# Patient Record
Sex: Female | Born: 1955 | Race: White | Hispanic: No | Marital: Married | Smoking: Former smoker
Health system: Southern US, Community
[De-identification: ages and names within clinical notes are randomized; demographics above are authoritative.]

## PROBLEM LIST (undated history)

## (undated) DIAGNOSIS — D126 Benign neoplasm of colon, unspecified: Secondary | ICD-10-CM

## (undated) DIAGNOSIS — C50919 Malignant neoplasm of unspecified site of unspecified female breast: Secondary | ICD-10-CM

## (undated) DIAGNOSIS — M1990 Unspecified osteoarthritis, unspecified site: Secondary | ICD-10-CM

## (undated) DIAGNOSIS — C50912 Malignant neoplasm of unspecified site of left female breast: Principal | ICD-10-CM

## (undated) DIAGNOSIS — K5790 Diverticulosis of intestine, part unspecified, without perforation or abscess without bleeding: Secondary | ICD-10-CM

## (undated) HISTORY — DX: Malignant neoplasm of unspecified site of unspecified female breast: C50.919

## (undated) HISTORY — DX: Diverticulosis of intestine, part unspecified, without perforation or abscess without bleeding: K57.90

## (undated) HISTORY — DX: Benign neoplasm of colon, unspecified: D12.6

## (undated) HISTORY — DX: Unspecified osteoarthritis, unspecified site: M19.90

## (undated) HISTORY — DX: Malignant neoplasm of unspecified site of left female breast: C50.912

---

## 1999-05-26 ENCOUNTER — Encounter: Admission: RE | Admit: 1999-05-26 | Discharge: 1999-05-26 | Payer: Self-pay | Admitting: Internal Medicine

## 1999-05-26 ENCOUNTER — Encounter: Payer: Self-pay | Admitting: Internal Medicine

## 2000-06-03 ENCOUNTER — Ambulatory Visit (HOSPITAL_COMMUNITY): Admission: RE | Admit: 2000-06-03 | Discharge: 2000-06-03 | Payer: Self-pay | Admitting: Obstetrics & Gynecology

## 2002-03-05 HISTORY — PX: MASTECTOMY, RADICAL: SHX710

## 2002-03-05 HISTORY — PX: TRAM: SHX5363

## 2002-06-04 DIAGNOSIS — C50912 Malignant neoplasm of unspecified site of left female breast: Secondary | ICD-10-CM | POA: Insufficient documentation

## 2002-06-04 HISTORY — DX: Malignant neoplasm of unspecified site of left female breast: C50.912

## 2002-06-08 ENCOUNTER — Other Ambulatory Visit: Admission: RE | Admit: 2002-06-08 | Discharge: 2002-06-08 | Payer: Self-pay | Admitting: Obstetrics & Gynecology

## 2002-06-11 ENCOUNTER — Encounter: Payer: Self-pay | Admitting: Obstetrics & Gynecology

## 2002-06-11 ENCOUNTER — Encounter: Admission: RE | Admit: 2002-06-11 | Discharge: 2002-06-11 | Payer: Self-pay | Admitting: Obstetrics & Gynecology

## 2002-06-11 ENCOUNTER — Other Ambulatory Visit: Admission: RE | Admit: 2002-06-11 | Discharge: 2002-06-11 | Payer: Self-pay | Admitting: Radiology

## 2002-06-18 ENCOUNTER — Encounter: Payer: Self-pay | Admitting: General Surgery

## 2002-06-18 ENCOUNTER — Encounter (HOSPITAL_COMMUNITY): Admission: RE | Admit: 2002-06-18 | Discharge: 2002-09-16 | Payer: Self-pay | Admitting: General Surgery

## 2002-06-19 ENCOUNTER — Encounter: Payer: Self-pay | Admitting: General Surgery

## 2002-06-22 ENCOUNTER — Encounter: Admission: RE | Admit: 2002-06-22 | Discharge: 2002-06-22 | Payer: Self-pay | Admitting: General Surgery

## 2002-06-22 ENCOUNTER — Encounter: Payer: Self-pay | Admitting: General Surgery

## 2002-06-23 ENCOUNTER — Encounter: Payer: Self-pay | Admitting: General Surgery

## 2002-06-23 ENCOUNTER — Ambulatory Visit (HOSPITAL_BASED_OUTPATIENT_CLINIC_OR_DEPARTMENT_OTHER): Admission: RE | Admit: 2002-06-23 | Discharge: 2002-06-23 | Payer: Self-pay | Admitting: General Surgery

## 2002-06-23 ENCOUNTER — Encounter (INDEPENDENT_AMBULATORY_CARE_PROVIDER_SITE_OTHER): Payer: Self-pay | Admitting: Specialist

## 2002-07-21 ENCOUNTER — Ambulatory Visit: Admission: RE | Admit: 2002-07-21 | Discharge: 2002-08-26 | Payer: Self-pay | Admitting: Radiation Oncology

## 2002-08-25 ENCOUNTER — Inpatient Hospital Stay (HOSPITAL_COMMUNITY): Admission: RE | Admit: 2002-08-25 | Discharge: 2002-08-27 | Payer: Self-pay | Admitting: General Surgery

## 2002-08-25 ENCOUNTER — Encounter (INDEPENDENT_AMBULATORY_CARE_PROVIDER_SITE_OTHER): Payer: Self-pay | Admitting: Specialist

## 2002-09-09 ENCOUNTER — Encounter: Payer: Self-pay | Admitting: Oncology

## 2002-09-09 ENCOUNTER — Ambulatory Visit (HOSPITAL_COMMUNITY): Admission: RE | Admit: 2002-09-09 | Discharge: 2002-09-09 | Payer: Self-pay | Admitting: Oncology

## 2002-09-14 ENCOUNTER — Ambulatory Visit (HOSPITAL_COMMUNITY): Admission: RE | Admit: 2002-09-14 | Discharge: 2002-09-14 | Payer: Self-pay | Admitting: Oncology

## 2002-09-14 ENCOUNTER — Encounter: Payer: Self-pay | Admitting: Oncology

## 2002-10-08 ENCOUNTER — Ambulatory Visit (HOSPITAL_COMMUNITY): Admission: RE | Admit: 2002-10-08 | Discharge: 2002-10-08 | Payer: Self-pay | Admitting: General Surgery

## 2002-10-08 ENCOUNTER — Encounter: Payer: Self-pay | Admitting: General Surgery

## 2003-02-10 ENCOUNTER — Ambulatory Visit (HOSPITAL_COMMUNITY): Admission: RE | Admit: 2003-02-10 | Discharge: 2003-02-10 | Payer: Self-pay | Admitting: Oncology

## 2003-03-03 ENCOUNTER — Ambulatory Visit (HOSPITAL_BASED_OUTPATIENT_CLINIC_OR_DEPARTMENT_OTHER): Admission: RE | Admit: 2003-03-03 | Discharge: 2003-03-03 | Payer: Self-pay | Admitting: Plastic Surgery

## 2003-03-04 ENCOUNTER — Ambulatory Visit (HOSPITAL_COMMUNITY): Admission: RE | Admit: 2003-03-04 | Discharge: 2003-03-04 | Payer: Self-pay | Admitting: Oncology

## 2003-06-15 ENCOUNTER — Other Ambulatory Visit: Admission: RE | Admit: 2003-06-15 | Discharge: 2003-06-15 | Payer: Self-pay | Admitting: Obstetrics & Gynecology

## 2003-06-15 ENCOUNTER — Encounter: Admission: RE | Admit: 2003-06-15 | Discharge: 2003-06-15 | Payer: Self-pay | Admitting: Oncology

## 2004-01-26 ENCOUNTER — Ambulatory Visit: Payer: Self-pay | Admitting: Oncology

## 2004-06-14 ENCOUNTER — Encounter: Admission: RE | Admit: 2004-06-14 | Discharge: 2004-06-14 | Payer: Self-pay | Admitting: Obstetrics & Gynecology

## 2004-07-24 ENCOUNTER — Other Ambulatory Visit: Admission: RE | Admit: 2004-07-24 | Discharge: 2004-07-24 | Payer: Self-pay | Admitting: Obstetrics & Gynecology

## 2004-08-01 ENCOUNTER — Ambulatory Visit: Payer: Self-pay | Admitting: Oncology

## 2004-10-27 ENCOUNTER — Ambulatory Visit: Payer: Self-pay | Admitting: Internal Medicine

## 2004-11-14 ENCOUNTER — Encounter: Admission: RE | Admit: 2004-11-14 | Discharge: 2004-11-14 | Payer: Self-pay | Admitting: Oncology

## 2005-02-01 ENCOUNTER — Ambulatory Visit: Payer: Self-pay | Admitting: Oncology

## 2005-06-19 ENCOUNTER — Ambulatory Visit: Payer: Self-pay | Admitting: Internal Medicine

## 2005-07-02 ENCOUNTER — Encounter: Admission: RE | Admit: 2005-07-02 | Discharge: 2005-07-02 | Payer: Self-pay | Admitting: Oncology

## 2005-07-29 ENCOUNTER — Ambulatory Visit: Payer: Self-pay | Admitting: Oncology

## 2005-08-03 LAB — FSH/LH: FSH: 41.7 m[IU]/mL

## 2005-08-03 LAB — CBC WITH DIFFERENTIAL/PLATELET
Basophils Absolute: 0 10*3/uL (ref 0.0–0.1)
EOS%: 0.9 % (ref 0.0–7.0)
HCT: 44.4 % (ref 34.8–46.6)
HGB: 15.4 g/dL (ref 11.6–15.9)
MCH: 31.3 pg (ref 26.0–34.0)
MCV: 90.1 fL (ref 81.0–101.0)
MONO%: 7.6 % (ref 0.0–13.0)
NEUT%: 67.6 % (ref 39.6–76.8)
RDW: 13 % (ref 11.3–14.5)

## 2005-08-03 LAB — COMPREHENSIVE METABOLIC PANEL
AST: 20 U/L (ref 0–37)
Albumin: 4.5 g/dL (ref 3.5–5.2)
Alkaline Phosphatase: 80 U/L (ref 39–117)
BUN: 14 mg/dL (ref 6–23)
Potassium: 4.5 mEq/L (ref 3.5–5.3)
Sodium: 142 mEq/L (ref 135–145)
Total Protein: 7.1 g/dL (ref 6.0–8.3)

## 2005-09-13 ENCOUNTER — Ambulatory Visit: Payer: Self-pay | Admitting: Oncology

## 2005-11-30 ENCOUNTER — Ambulatory Visit: Payer: Self-pay | Admitting: Oncology

## 2005-12-12 LAB — COMPREHENSIVE METABOLIC PANEL
ALT: 17 U/L (ref 0–40)
AST: 14 U/L (ref 0–37)
Calcium: 9.5 mg/dL (ref 8.4–10.5)
Chloride: 104 mEq/L (ref 96–112)
Creatinine, Ser: 0.75 mg/dL (ref 0.40–1.20)
Potassium: 3.7 mEq/L (ref 3.5–5.3)
Sodium: 140 mEq/L (ref 135–145)

## 2005-12-12 LAB — CBC WITH DIFFERENTIAL/PLATELET
BASO%: 0.4 % (ref 0.0–2.0)
EOS%: 1 % (ref 0.0–7.0)
MCH: 31.4 pg (ref 26.0–34.0)
MCHC: 34.9 g/dL (ref 32.0–36.0)
NEUT%: 68.1 % (ref 39.6–76.8)
RBC: 4.88 10*6/uL (ref 3.70–5.32)
RDW: 12.2 % (ref 11.3–14.5)
lymph#: 1.3 10*3/uL (ref 0.9–3.3)

## 2006-04-05 ENCOUNTER — Ambulatory Visit: Payer: Self-pay | Admitting: Oncology

## 2006-04-10 LAB — COMPREHENSIVE METABOLIC PANEL
AST: 24 U/L (ref 0–37)
Alkaline Phosphatase: 110 U/L (ref 39–117)
BUN: 13 mg/dL (ref 6–23)
Creatinine, Ser: 0.77 mg/dL (ref 0.40–1.20)
Glucose, Bld: 81 mg/dL (ref 70–99)
Potassium: 4.4 mEq/L (ref 3.5–5.3)
Total Bilirubin: 0.4 mg/dL (ref 0.3–1.2)

## 2006-04-10 LAB — CBC WITH DIFFERENTIAL/PLATELET
Basophils Absolute: 0 10*3/uL (ref 0.0–0.1)
Eosinophils Absolute: 0.1 10*3/uL (ref 0.0–0.5)
HGB: 16.1 g/dL — ABNORMAL HIGH (ref 11.6–15.9)
MCV: 87.8 fL (ref 81.0–101.0)
MONO#: 0.5 10*3/uL (ref 0.1–0.9)
MONO%: 9.1 % (ref 0.0–13.0)
NEUT#: 3.4 10*3/uL (ref 1.5–6.5)
RDW: 12.5 % (ref 11.3–14.5)
WBC: 5.1 10*3/uL (ref 3.9–10.0)
lymph#: 1 10*3/uL (ref 0.9–3.3)

## 2006-07-30 ENCOUNTER — Encounter: Admission: RE | Admit: 2006-07-30 | Discharge: 2006-07-30 | Payer: Self-pay | Admitting: Obstetrics & Gynecology

## 2006-08-05 ENCOUNTER — Ambulatory Visit: Payer: Self-pay | Admitting: Oncology

## 2006-08-14 LAB — CBC WITH DIFFERENTIAL/PLATELET
Basophils Absolute: 0 10*3/uL (ref 0.0–0.1)
EOS%: 2.6 % (ref 0.0–7.0)
Eosinophils Absolute: 0.1 10*3/uL (ref 0.0–0.5)
HCT: 44.1 % (ref 34.8–46.6)
HGB: 15.8 g/dL (ref 11.6–15.9)
MCH: 31.5 pg (ref 26.0–34.0)
MCV: 87.8 fL (ref 81.0–101.0)
MONO%: 10 % (ref 0.0–13.0)
NEUT#: 2.7 10*3/uL (ref 1.5–6.5)
NEUT%: 57.9 % (ref 39.6–76.8)
Platelets: 254 10*3/uL (ref 145–400)
RDW: 12.1 % (ref 11.3–14.5)

## 2006-08-14 LAB — CANCER ANTIGEN 27.29: CA 27.29: 32 U/mL (ref 0–39)

## 2006-08-14 LAB — COMPREHENSIVE METABOLIC PANEL
AST: 23 U/L (ref 0–37)
Albumin: 4.3 g/dL (ref 3.5–5.2)
Alkaline Phosphatase: 89 U/L (ref 39–117)
BUN: 15 mg/dL (ref 6–23)
Calcium: 9.2 mg/dL (ref 8.4–10.5)
Creatinine, Ser: 0.79 mg/dL (ref 0.40–1.20)
Glucose, Bld: 82 mg/dL (ref 70–99)
Potassium: 3.9 mEq/L (ref 3.5–5.3)

## 2007-02-24 ENCOUNTER — Ambulatory Visit: Payer: Self-pay | Admitting: Oncology

## 2007-04-21 ENCOUNTER — Ambulatory Visit: Payer: Self-pay | Admitting: Oncology

## 2007-04-23 LAB — CBC WITH DIFFERENTIAL/PLATELET
Basophils Absolute: 0 10*3/uL (ref 0.0–0.1)
EOS%: 1.2 % (ref 0.0–7.0)
HGB: 15.5 g/dL (ref 11.6–15.9)
MCH: 32.3 pg (ref 26.0–34.0)
MONO#: 0.3 10*3/uL (ref 0.1–0.9)
NEUT#: 4.1 10*3/uL (ref 1.5–6.5)
RDW: 12.2 % (ref 11.3–14.5)
WBC: 5.8 10*3/uL (ref 3.9–10.0)
lymph#: 1.2 10*3/uL (ref 0.9–3.3)

## 2007-04-24 LAB — COMPREHENSIVE METABOLIC PANEL
AST: 15 U/L (ref 0–37)
Alkaline Phosphatase: 92 U/L (ref 39–117)
BUN: 18 mg/dL (ref 6–23)
Creatinine, Ser: 0.76 mg/dL (ref 0.40–1.20)

## 2007-04-24 LAB — FSH/LH
FSH: 33.6 m[IU]/mL
LH: 25.8 m[IU]/mL

## 2007-04-24 LAB — VITAMIN D 25 HYDROXY (VIT D DEFICIENCY, FRACTURES): Vit D, 25-Hydroxy: 15 ng/mL — ABNORMAL LOW (ref 30–89)

## 2007-04-28 LAB — VITAMIN D 1,25 DIHYDROXY: Vit D, 1,25-Dihydroxy: 24 pg/mL (ref 6–62)

## 2007-05-07 LAB — ESTRADIOL, ULTRA SENS: Estradiol, Ultra Sensitive: 7 pg/mL

## 2007-05-13 ENCOUNTER — Encounter: Admission: RE | Admit: 2007-05-13 | Discharge: 2007-05-13 | Payer: Self-pay | Admitting: Oncology

## 2007-06-07 ENCOUNTER — Encounter: Payer: Self-pay | Admitting: Internal Medicine

## 2007-07-04 ENCOUNTER — Ambulatory Visit: Payer: Self-pay | Admitting: Oncology

## 2007-07-04 LAB — CBC WITH DIFFERENTIAL/PLATELET
EOS%: 2.3 % (ref 0.0–7.0)
Eosinophils Absolute: 0.1 10*3/uL (ref 0.0–0.5)
MCH: 31.9 pg (ref 26.0–34.0)
MCV: 89.7 fL (ref 81.0–101.0)
MONO%: 5.5 % (ref 0.0–13.0)
NEUT#: 3.5 10*3/uL (ref 1.5–6.5)
RBC: 4.57 10*6/uL (ref 3.70–5.32)
RDW: 11.9 % (ref 11.3–14.5)
lymph#: 1.5 10*3/uL (ref 0.9–3.3)

## 2007-07-04 LAB — COMPREHENSIVE METABOLIC PANEL
AST: 24 U/L (ref 0–37)
Albumin: 4.5 g/dL (ref 3.5–5.2)
Alkaline Phosphatase: 88 U/L (ref 39–117)
Chloride: 108 mEq/L (ref 96–112)
Potassium: 3.7 mEq/L (ref 3.5–5.3)
Sodium: 139 mEq/L (ref 135–145)
Total Protein: 7.3 g/dL (ref 6.0–8.3)

## 2007-07-04 LAB — CANCER ANTIGEN 27.29: CA 27.29: 40 U/mL — ABNORMAL HIGH (ref 0–39)

## 2007-07-04 LAB — FOLLICLE STIMULATING HORMONE: FSH: 47.2 m[IU]/mL

## 2007-07-04 LAB — LACTATE DEHYDROGENASE: LDH: 176 U/L (ref 94–250)

## 2007-07-10 LAB — ESTRADIOL, ULTRA SENS: Estradiol, Ultra Sensitive: 4 pg/mL

## 2007-07-31 ENCOUNTER — Encounter: Admission: RE | Admit: 2007-07-31 | Discharge: 2007-07-31 | Payer: Self-pay | Admitting: Oncology

## 2007-08-05 ENCOUNTER — Encounter: Admission: RE | Admit: 2007-08-05 | Discharge: 2007-08-05 | Payer: Self-pay | Admitting: Oncology

## 2007-09-25 ENCOUNTER — Ambulatory Visit: Payer: Self-pay | Admitting: Oncology

## 2007-10-22 LAB — CANCER ANTIGEN 27.29: CA 27.29: 47 U/mL — ABNORMAL HIGH (ref 0–39)

## 2007-10-24 ENCOUNTER — Ambulatory Visit: Payer: Self-pay | Admitting: Internal Medicine

## 2007-10-24 DIAGNOSIS — M79609 Pain in unspecified limb: Secondary | ICD-10-CM

## 2007-10-29 ENCOUNTER — Telehealth: Payer: Self-pay | Admitting: Internal Medicine

## 2007-10-29 ENCOUNTER — Ambulatory Visit: Payer: Self-pay | Admitting: Internal Medicine

## 2007-10-30 ENCOUNTER — Telehealth (INDEPENDENT_AMBULATORY_CARE_PROVIDER_SITE_OTHER): Payer: Self-pay

## 2007-11-03 ENCOUNTER — Telehealth: Payer: Self-pay | Admitting: Internal Medicine

## 2007-12-22 ENCOUNTER — Ambulatory Visit: Payer: Self-pay | Admitting: Oncology

## 2007-12-30 LAB — CANCER ANTIGEN 27.29: CA 27.29: 41 U/mL — ABNORMAL HIGH (ref 0–39)

## 2008-02-06 ENCOUNTER — Ambulatory Visit: Payer: Self-pay | Admitting: Oncology

## 2008-04-08 ENCOUNTER — Ambulatory Visit: Payer: Self-pay | Admitting: Internal Medicine

## 2008-04-08 DIAGNOSIS — K5289 Other specified noninfective gastroenteritis and colitis: Secondary | ICD-10-CM | POA: Insufficient documentation

## 2008-06-03 ENCOUNTER — Ambulatory Visit: Payer: Self-pay | Admitting: Oncology

## 2008-06-08 LAB — CBC WITH DIFFERENTIAL/PLATELET
BASO%: 0.4 % (ref 0.0–2.0)
LYMPH%: 22.7 % (ref 14.0–49.7)
MCHC: 35.1 g/dL (ref 31.5–36.0)
MONO#: 0.3 10*3/uL (ref 0.1–0.9)
NEUT#: 3.8 10*3/uL (ref 1.5–6.5)
Platelets: 266 10*3/uL (ref 145–400)
RBC: 5.01 10*6/uL (ref 3.70–5.45)
RDW: 13.1 % (ref 11.2–14.5)
WBC: 5.5 10*3/uL (ref 3.9–10.3)

## 2008-06-09 LAB — CANCER ANTIGEN 27.29: CA 27.29: 34 U/mL (ref 0–39)

## 2008-06-09 LAB — COMPREHENSIVE METABOLIC PANEL
ALT: 29 U/L (ref 0–35)
Albumin: 4.5 g/dL (ref 3.5–5.2)
CO2: 22 mEq/L (ref 19–32)
Potassium: 4.2 mEq/L (ref 3.5–5.3)
Sodium: 137 mEq/L (ref 135–145)
Total Bilirubin: 0.6 mg/dL (ref 0.3–1.2)
Total Protein: 7.3 g/dL (ref 6.0–8.3)

## 2008-06-18 LAB — ESTRADIOL, ULTRA SENS

## 2008-08-10 ENCOUNTER — Encounter: Admission: RE | Admit: 2008-08-10 | Discharge: 2008-08-10 | Payer: Self-pay | Admitting: Oncology

## 2009-02-02 ENCOUNTER — Ambulatory Visit: Payer: Self-pay | Admitting: Oncology

## 2009-03-14 ENCOUNTER — Ambulatory Visit: Payer: Self-pay | Admitting: Oncology

## 2009-03-16 LAB — CBC WITH DIFFERENTIAL/PLATELET
Basophils Absolute: 0 10*3/uL (ref 0.0–0.1)
EOS%: 1.6 % (ref 0.0–7.0)
Eosinophils Absolute: 0.1 10*3/uL (ref 0.0–0.5)
HGB: 16.5 g/dL — ABNORMAL HIGH (ref 11.6–15.9)
MCV: 90.6 fL (ref 79.5–101.0)
MONO%: 6.2 % (ref 0.0–14.0)
NEUT#: 4.1 10*3/uL (ref 1.5–6.5)
RBC: 5.26 10*6/uL (ref 3.70–5.45)
RDW: 12.4 % (ref 11.2–14.5)
lymph#: 1.4 10*3/uL (ref 0.9–3.3)

## 2009-03-17 LAB — COMPREHENSIVE METABOLIC PANEL
AST: 30 U/L (ref 0–37)
Albumin: 4.4 g/dL (ref 3.5–5.2)
Alkaline Phosphatase: 100 U/L (ref 39–117)
BUN: 12 mg/dL (ref 6–23)
Calcium: 10.2 mg/dL (ref 8.4–10.5)
Chloride: 105 mEq/L (ref 96–112)
Glucose, Bld: 67 mg/dL — ABNORMAL LOW (ref 70–99)
Potassium: 3.7 mEq/L (ref 3.5–5.3)
Sodium: 140 mEq/L (ref 135–145)
Total Protein: 7.6 g/dL (ref 6.0–8.3)

## 2009-04-07 ENCOUNTER — Ambulatory Visit: Payer: Self-pay | Admitting: Internal Medicine

## 2009-04-07 DIAGNOSIS — J069 Acute upper respiratory infection, unspecified: Secondary | ICD-10-CM | POA: Insufficient documentation

## 2009-06-21 ENCOUNTER — Other Ambulatory Visit: Admission: RE | Admit: 2009-06-21 | Discharge: 2009-06-21 | Payer: Self-pay | Admitting: Obstetrics and Gynecology

## 2009-08-16 ENCOUNTER — Encounter: Admission: RE | Admit: 2009-08-16 | Discharge: 2009-08-16 | Payer: Self-pay | Admitting: Oncology

## 2009-09-02 DIAGNOSIS — D126 Benign neoplasm of colon, unspecified: Secondary | ICD-10-CM

## 2009-09-02 HISTORY — DX: Benign neoplasm of colon, unspecified: D12.6

## 2009-11-17 ENCOUNTER — Ambulatory Visit: Payer: Self-pay | Admitting: Internal Medicine

## 2009-11-17 DIAGNOSIS — M549 Dorsalgia, unspecified: Secondary | ICD-10-CM | POA: Insufficient documentation

## 2010-03-02 ENCOUNTER — Ambulatory Visit: Payer: Self-pay | Admitting: Oncology

## 2010-03-26 ENCOUNTER — Encounter: Payer: Self-pay | Admitting: Oncology

## 2010-04-06 NOTE — Letter (Signed)
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   [place], [place] [address]   Phone: [phone]  Fax: [phone]    November 17, 2009   Employee:  BEVA REMUND    To Whom It May Concern:   For Medical reasons, please excuse the above named employee from work for the following dates:  Start:   11-18-09  End:   11-21-09  If you need additional information, please feel free to contact our office.         Sincerely,    Gordy Savers  MD

## 2010-04-06 NOTE — Letter (Signed)
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   [place], [place] [address]   Phone: [phone]  Fax: [phone]    April 07, 2009   Employee:  LANEKA MCGRORY    To Whom It May Concern:   For Medical reasons, please excuse the above named employee from work for the following dates:  Start:   04-07-2009  End:   04-09-2009  If you need additional information, please feel free to contact our office.         Sincerely,    Gordy Savers  MD

## 2010-04-06 NOTE — Assessment & Plan Note (Signed)
Summary: ?pulled muscle in lower back and hip area/cjr   Vital Signs:  Patient profile:   55 year old female Weight:      161 pounds Temp:     98.6 degrees F oral BP sitting:   114 / 70  (right arm) Cuff size:   regular  Vitals Entered By: Duard Brady LPN (November 17, 2009 3:30 PM) CC: c/o pulled muscle, (L) hip , unloading lawnmower          **declines flu vaccine Is Patient Diabetic? No   CC:  c/o pulled muscle, (L) hip , and unloading lawnmower          **declines flu vaccine.  History of Present Illness: 55 year old patient, who presents with a chief complaint of left lumbar back pain after unloading a lawnmower at home.  Last night.  Pain is moderate and is aggravated by twisting, bending, and movement.  No radicular pain down the left leg.  She states she has had some minor low back issues over the years.  Allergies: 1)  ! Sulfamethoxazole (Sulfamethoxazole)  Physical Exam  General:  Well-developed,well-nourished,in no acute distress; alert,appropriate and cooperative throughout examination Msk:  negative straight leg testing;  range of motion left hip intact;   she had considerable tight tense lumbar musculature on the left, which was slightly tender to palpation   Impression & Recommendations:  Problem # 1:  BACK PAIN (ICD-724.5)  Her updated medication list for this problem includes:    Cyclobenzaprine Hcl 10 Mg Tabs (Cyclobenzaprine hcl) ..... One every 8 hours as needed for muscle spasm  Her updated medication list for this problem includes:    Cyclobenzaprine Hcl 10 Mg Tabs (Cyclobenzaprine hcl) ..... One every 8 hours as needed for muscle spasm  Complete Medication List: 1)  Cyclobenzaprine Hcl 10 Mg Tabs (Cyclobenzaprine hcl) .... One every 8 hours as needed for muscle spasm  Patient Instructions: 1)  Most patients (90%) with low back pain will improve with time (2-6 weeks). Keep active but avoid activities that are painful. Apply moist heat and/or  ice to lower back several times a day. Prescriptions: CYCLOBENZAPRINE HCL 10 MG TABS (CYCLOBENZAPRINE HCL) one every 8 hours as needed for muscle spasm  #30 x 0   Entered and Authorized by:   Gordy Savers  MD   Signed by:   Gordy Savers  MD on 11/17/2009   Method used:   Electronically to        [facility]  [place] [address] (retail)       [address]       [place], [place]  [address]       Ph: [phone] or [phone]       Fax: [phone]   RxID:   [id number]

## 2010-04-06 NOTE — Assessment & Plan Note (Signed)
Summary: SORE THROAT, CONGESTION // RS   Vital Signs:  Patient profile:   55 year old female Weight:      164 pounds Temp:     99.4 degrees F oral BP sitting:   114 / 76  (right arm) Cuff size:   regular  Vitals Entered By: Raechel Ache, RN (April 07, 2009 1:51 PM) CC: C/o sore throat, fever, chills, aches and dry cough   CC:  C/o sore throat, fever, chills, and aches and dry cough.  History of Present Illness: 55 year old patient, who presents with a two-day history of sore throat, fever, chilliness, rhinorrhea, dry, nonproductive cough.  She describes achiness.  She missed work today and needs a note.  No abdominal pain, chest pain or purulent sputum production.  Denies any shortness of breath.  Allergies: 1)  ! Sulfamethoxazole (Sulfamethoxazole)  Past History:  Past Medical History: unremarkable  Review of Systems       The patient complains of anorexia, fever, hoarseness, and prolonged cough.    Physical Exam  General:  Well-developed,well-nourished,in no acute distress; alert,appropriate and cooperative throughout examination Head:  Normocephalic and atraumatic without obvious abnormalities. No apparent alopecia or balding. Eyes:  No corneal or conjunctival inflammation noted. EOMI. Perrla. Funduscopic exam benign, without hemorrhages, exudates or papilledema. Vision grossly normal. Ears:  External ear exam shows no significant lesions or deformities.  Otoscopic examination reveals clear canals, tympanic membranes are intact bilaterally without bulging, retraction, inflammation or discharge. Hearing is grossly normal bilaterally. Nose:  External nasal examination shows no deformity or inflammation. Nasal mucosa are pink and moist without lesions or exudates. Mouth:  pharyngeal erythema.  pharyngeal erythema.   Neck:  No deformities, masses, or tenderness noted. Lungs:  Normal respiratory effort, chest expands symmetrically. Lungs are clear to auscultation, no  crackles or wheezes. Heart:  Normal rate and regular rhythm. S1 and S2 normal without gallop, murmur, click, rub or other extra sounds. Abdomen:  Bowel sounds positive,abdomen soft and non-tender without masses, organomegaly or hernias noted.   Impression & Recommendations:  Problem # 1:  URI (ICD-465.9)  The following medications were removed from the medication list:    Promethazine Hcl 25 Mg Tabs (Promethazine hcl) ..... One every 6 hours as needed for nausea Her updated medication list for this problem includes:    Hydrocodone-homatropine 5-1.5 Mg/70ml Syrp (Hydrocodone-homatropine) .Marland Kitchen... 1 teaspoon every 6 hours as needed for cough  The following medications were removed from the medication list:    Promethazine Hcl 25 Mg Tabs (Promethazine hcl) ..... One every 6 hours as needed for nausea Her updated medication list for this problem includes:    Hydrocodone-homatropine 5-1.5 Mg/27ml Syrp (Hydrocodone-homatropine) .Marland Kitchen... 1 teaspoon every 6 hours as needed for cough  Complete Medication List: 1)  Hydrocodone-homatropine 5-1.5 Mg/35ml Syrp (Hydrocodone-homatropine) .Marland Kitchen.. 1 teaspoon every 6 hours as needed for cough  Patient Instructions: 1)  Get plenty of rest, drink lots of clear liquids, and use Tylenol or Ibuprofen for fever and comfort. Return in 7-10 days if you're not better:sooner if you're feeling worse. Prescriptions: HYDROCODONE-HOMATROPINE 5-1.5 MG/5ML SYRP (HYDROCODONE-HOMATROPINE) 1 teaspoon every 6 hours as needed for cough  #6 oz x 0   Entered and Authorized by:   Gordy Savers  MD   Signed by:   Gordy Savers  MD on 04/07/2009   Method used:   Print then Give to Patient   RxID:   [id number]

## 2010-04-11 ENCOUNTER — Other Ambulatory Visit: Payer: Self-pay | Admitting: Oncology

## 2010-04-11 ENCOUNTER — Encounter (HOSPITAL_BASED_OUTPATIENT_CLINIC_OR_DEPARTMENT_OTHER): Payer: Federal, State, Local not specified - PPO | Admitting: Oncology

## 2010-04-11 DIAGNOSIS — C50419 Malignant neoplasm of upper-outer quadrant of unspecified female breast: Secondary | ICD-10-CM

## 2010-04-11 LAB — COMPREHENSIVE METABOLIC PANEL
Albumin: 4.3 g/dL (ref 3.5–5.2)
Alkaline Phosphatase: 93 U/L (ref 39–117)
BUN: 17 mg/dL (ref 6–23)
Creatinine, Ser: 0.73 mg/dL (ref 0.40–1.20)
Glucose, Bld: 89 mg/dL (ref 70–99)
Potassium: 3.6 mEq/L (ref 3.5–5.3)
Total Bilirubin: 0.9 mg/dL (ref 0.3–1.2)

## 2010-04-11 LAB — CBC WITH DIFFERENTIAL/PLATELET
BASO%: 0.6 % (ref 0.0–2.0)
Basophils Absolute: 0 10*3/uL (ref 0.0–0.1)
EOS%: 1.1 % (ref 0.0–7.0)
HCT: 44.5 % (ref 34.8–46.6)
HGB: 15.6 g/dL (ref 11.6–15.9)
LYMPH%: 24.6 % (ref 14.0–49.7)
MCH: 31.2 pg (ref 25.1–34.0)
MCHC: 35 g/dL (ref 31.5–36.0)
MCV: 89.2 fL (ref 79.5–101.0)
MONO%: 8.2 % (ref 0.0–14.0)
NEUT%: 65.5 % (ref 38.4–76.8)

## 2010-04-11 LAB — VITAMIN D 25 HYDROXY (VIT D DEFICIENCY, FRACTURES): Vit D, 25-Hydroxy: 37 ng/mL (ref 30–89)

## 2010-04-18 ENCOUNTER — Encounter: Payer: Federal, State, Local not specified - PPO | Admitting: Oncology

## 2010-04-18 ENCOUNTER — Encounter: Payer: Self-pay | Admitting: Internal Medicine

## 2010-04-18 ENCOUNTER — Other Ambulatory Visit: Payer: Self-pay | Admitting: Oncology

## 2010-04-18 DIAGNOSIS — Z780 Asymptomatic menopausal state: Secondary | ICD-10-CM

## 2010-04-18 DIAGNOSIS — Z1231 Encounter for screening mammogram for malignant neoplasm of breast: Secondary | ICD-10-CM

## 2010-04-19 ENCOUNTER — Encounter: Payer: Self-pay | Admitting: Internal Medicine

## 2010-04-19 ENCOUNTER — Ambulatory Visit (INDEPENDENT_AMBULATORY_CARE_PROVIDER_SITE_OTHER): Payer: Federal, State, Local not specified - PPO | Admitting: Internal Medicine

## 2010-04-19 VITALS — BP 100/70 | Temp 98.4°F | Wt 164.0 lb

## 2010-04-19 DIAGNOSIS — R51 Headache: Secondary | ICD-10-CM

## 2010-04-19 NOTE — Progress Notes (Signed)
  Subjective:    Patient ID: Kristen Burke, female    DOB: May 20, 1955, 55 y.o.   MRN: [id number]  HPI  55 year old patient, who presents with the chief complaint of headache.  Superbowl Sunday, she bent down to pick up an object on the floor and when she stood she traumatized the right occipital head region on furniture.  Since this time.  She has had some nausea that has improved and largely resolved.  She still has daily headaches that also are improving and responding well to ibuprofen.  No focal neurological symptoms    Review of Systems  Constitutional: Negative.   HENT: Negative for hearing loss, congestion, sore throat, rhinorrhea, dental problem, sinus pressure and tinnitus.   Eyes: Negative for pain, discharge and visual disturbance.  Respiratory: Negative for cough and shortness of breath.   Cardiovascular: Negative for chest pain, palpitations and leg swelling.  Gastrointestinal: Positive for nausea. Negative for vomiting, abdominal pain, diarrhea, constipation, blood in stool and abdominal distention.  Genitourinary: Negative for dysuria, urgency, frequency, hematuria, flank pain, vaginal bleeding, vaginal discharge, difficulty urinating, vaginal pain and pelvic pain.  Musculoskeletal: Negative for joint swelling, arthralgias and gait problem.  Skin: Negative for rash.  Neurological: Positive for headaches. Negative for dizziness, syncope, speech difficulty, weakness and numbness.  Hematological: Negative for adenopathy.  Psychiatric/Behavioral: Negative for behavioral problems, dysphoric mood and agitation. The patient is not nervous/anxious.        Objective:   Physical Exam  Constitutional: She is oriented to person, place, and time. She appears well-developed and well-nourished.  HENT:  Head: Normocephalic.  Right Ear: External ear normal.  Left Ear: External ear normal.  Mouth/Throat: Oropharynx is clear and moist.       Slight tenderness over the right occipital scalp  area but no hematoma  Eyes: Conjunctivae and EOM are normal. Pupils are equal, round, and reactive to light.  Neck: Normal range of motion. Neck supple. No thyromegaly present.  Cardiovascular: Normal rate, regular rhythm, normal heart sounds and intact distal pulses.   Pulmonary/Chest: Effort normal and breath sounds normal.  Abdominal: She exhibits no mass. There is no tenderness.  Musculoskeletal: Normal range of motion.  Lymphadenopathy:    She has no cervical adenopathy.  Neurological: She is alert and oriented to person, place, and time. She displays normal reflexes. No cranial nerve deficit. She exhibits normal muscle tone. Coordination normal.  Skin: Skin is warm and dry. No rash noted.  Psychiatric: She has a normal mood and affect. Her behavior is normal.          Assessment & Plan:  Headache syndrome-probable postconcussive syndrome.  Situation was discussed at length her symptoms are improving and her clinical exam is unremarkable.  Imaging studies not felt to be indicated at this time.  If she develops persistent or worsening symptoms.  Will reassess

## 2010-04-19 NOTE — Patient Instructions (Signed)
Call or return to clinic prn if these symptoms worsen or fail to improve as anticipated.

## 2010-07-19 ENCOUNTER — Ambulatory Visit (INDEPENDENT_AMBULATORY_CARE_PROVIDER_SITE_OTHER): Payer: Federal, State, Local not specified - PPO | Admitting: Internal Medicine

## 2010-07-19 ENCOUNTER — Encounter: Payer: Self-pay | Admitting: Internal Medicine

## 2010-07-19 VITALS — BP 120/80 | Temp 98.3°F | Wt 169.0 lb

## 2010-07-19 DIAGNOSIS — R51 Headache: Secondary | ICD-10-CM

## 2010-07-19 DIAGNOSIS — M542 Cervicalgia: Secondary | ICD-10-CM

## 2010-07-19 MED ORDER — METHYLPREDNISOLONE ACETATE 80 MG/ML IJ SUSP
80.0000 mg | Freq: Once | INTRAMUSCULAR | Status: AC
  Administered 2010-07-19: 80 mg via INTRAMUSCULAR

## 2010-07-19 MED ORDER — CYCLOBENZAPRINE HCL 10 MG PO TABS: 10.0000 mg | ORAL_TABLET | Freq: Three times a day (TID) | ORAL | Status: DC | PRN

## 2010-07-19 NOTE — Patient Instructions (Signed)
Aleve 2 tablets twice daily  Apply heat to the affected area 3 or 4 times daily as needed Gentle massage and stretching as discussed  Call or return to clinic prn if these symptoms worsen or fail to improve as anticipated.

## 2010-07-19 NOTE — Progress Notes (Signed)
  Subjective:    Patient ID: Kristen Burke, female    DOB: [birth year], 55 y.o.   MRN: [id number]  HPI  55 year old patient who has a one-week history of left posterior neck upper back and shoulder discomfort. She does considerable manual activities at work. Sunday she had significant headaches and worsening neck and upper shoulder discomfort she has had some occasional nausea due to persistent headaches and left neck and shoulder discomfort she presents to the office today for evaluation. She has been taking Aleve without benefit she has used Flexeril in the past due to low back pain with benefit    Review of Systems  Constitutional: Negative.  Negative for fever.  HENT: Positive for neck pain and neck stiffness. Negative for hearing loss, congestion, sore throat, rhinorrhea, dental problem, sinus pressure and tinnitus.   Eyes: Negative for pain, discharge and visual disturbance.  Respiratory: Negative for cough and shortness of breath.   Cardiovascular: Negative for chest pain, palpitations and leg swelling.  Gastrointestinal: Negative for nausea, vomiting, abdominal pain, diarrhea, constipation, blood in stool and abdominal distention.  Genitourinary: Negative for dysuria, urgency, frequency, hematuria, flank pain, vaginal bleeding, vaginal discharge, difficulty urinating, vaginal pain and pelvic pain.  Musculoskeletal: Negative for joint swelling, arthralgias and gait problem.  Skin: Negative for rash.  Neurological: Positive for headaches. Negative for dizziness, syncope, speech difficulty, weakness and numbness.  Hematological: Negative for adenopathy.  Psychiatric/Behavioral: Negative for behavioral problems, dysphoric mood and agitation. The patient is not nervous/anxious.        Objective:   Physical Exam  Constitutional: She is oriented to person, place, and time. She appears well-developed and well-nourished. No distress.  Musculoskeletal: Normal range of motion. She exhibits no  edema and no tenderness.       Range of motion of the head and neck normal although uncomfortable especially with head turning to the left. The left posterior neck musculature and left trapezius muscle tender to touch  Neurological: She is alert and oriented to person, place, and time.          Assessment & Plan:  Headache neck pain. We'll treat with Depo-Medrol sense believe has not been effective. We'll refill her Flexeril. Warm compresses gentle massage and stretching all encouraged

## 2010-07-21 NOTE — Discharge Summary (Signed)
Kristen Burke, Kristen Burke                           ACCOUNT NO.:  [id number]   MEDICAL RECORD NO.:  [id number]                   PATIENT TYPE:  INP   LOCATION:  5714                                 FACILITY:  [facility]   PHYSICIAN:  Kristen Burke, M.D.               DATE OF BIRTH:  22-Sep-1955   DATE OF ADMISSION:  08/25/2002  DATE OF DISCHARGE:  08/27/2002                                 DISCHARGE SUMMARY   ADMISSION DIAGNOSES:  1. Left breast cancer.  2. Acquired abscess left breast.   DISCHARGE DIAGNOSES:  1. Left breast cancer.  2. Acquired abscess left breast.   OPERATION PERFORMED:  Left total mastectomy with immediate reconstruction  utilizing left ipsilateral TRAM flap reconstruction.   CHIEF COMPLAINT:  I have breast cancer.   HISTORY OF PRESENT ILLNESS:  [name] who palpated a new  mass in her left breast and was seen by her gynecologist back in April of  this year.  Ultrasound suggested two small masses in the upper outer  quadrant of the left breast.  Core needle biopsies revealed both to be  carcinoma.  The patient was informed of this and the patient opted to  undergo lumpectomy and radiation therapy.  Lumpectomy was performed along  with sentinel node biopsy.  Sentinel node was negative.  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  For this reason Dr. Earlene Plater opted to proceed with total mastectomy.  The  patient is admitted to the hospital on June 22 for completion of the  mastectomy on the left side.  She also has opted to undergo immediate  reconstruction with an ipsilateral TRAM flap.   PAST MEDICAL HISTORY:  Negative.   PAST SURGICAL HISTORY:  1. Lumpectomy.  2. Sentinel node biopsy.   FAMILY HISTORY:  Positive for mother having breast cancer.   SOCIAL HISTORY:  The patient is married.  She has two children.  She denies  tobacco or alcohol use.   REVIEW OF SYSTEMS:  Negative for any ear,  nose, throat, pulmonary, chest,  cardiovascular, GI, GU problems.   PHYSICAL EXAMINATION:  Please see admission H&P for complete physical  examination.   ADMISSION LABORATORY VALUES:  Hemoglobin 15.5, hematocrit 44.3.  Electrolytes were normal.  Urinalysis was negative.  Chest x-ray showed no  active lung disease.  There is no cardiogram on the chart for review.   HOSPITAL COURSE:  On the day of admission the patient was taken to the  operating room where she underwent left mastectomy and ipsilateral left TRAM  flap reconstruction.  Postoperative course has been completely uneventful.  She was started on a diet on the night of surgery.  The morning after  surgery the diet was advanced.  The patient was allowed to ambulate the  first postoperative day.  Her left TRAM flap looks fine with excellent  capillary refill, good  color, and appears to be completely viable.  On the  second postoperative day the dressings were all removed.  The abdominal  wound was fine, healing nicely, no evidence of infection.  Of note, this is  viable.  All drains will remain in place and are draining greater than 30 mL  per 24 hours.  The patient has been instructed on drain care.  All  instructions to the patient have been supplied by my office and the patient  was advised to call if there are any further problems.   DISCHARGE MEDICATIONS:  1. Keflex 500 mg q.i.d.  2. Vicodin one to two q.4h. p.r.n. pain.   FOLLOW UP:  Will be provided in my office in four days for removal of  drains.                                               Kristen Burke, M.D.    WBB/MEDQ  D:  08/27/2002  T:  08/27/2002  Job:  [id number]

## 2010-07-21 NOTE — Op Note (Signed)
Kristen Burke, Kristen Burke                           ACCOUNT NO.:  [id number]   MEDICAL RECORD NO.:  [id number]                   PATIENT TYPE:  OUT   LOCATION:  XRAY                                 FACILITY:  [facility]   PHYSICIAN:  Alfredia Ferguson, M.D.               DATE OF BIRTH:  1955-12-29   DATE OF PROCEDURE:  03/03/2003  DATE OF DISCHARGE:  02/10/2003                                 OPERATIVE REPORT   PREOPERATIVE DIAGNOSIS:  1. History of breast cancer.  2. Acquired absence of left breast.   POSTOPERATIVE DIAGNOSIS:  1. History of breast cancer.  2. Acquired absence of left breast.   OPERATION PERFORMED:  Left nipple reconstruction.   SURGEON:  Alfredia Ferguson, M.D.   ANESTHESIA:  None required.   INDICATIONS FOR PROCEDURE:  The patient is a 55 year old woman who is status  post left mastectomy and immediate reconstruction with a TRAM flap.  She is  now ready for nipple reconstruction.  She understands the risks of the  surgery including the possibility of failure of the nipple.  She understands  the risk of asymmetry of the two nipples.  In spite of that she wished to  proceed with the surgery.   DESCRIPTION OF PROCEDURE:  Location of the nipple was chosen with the  patient in the sitting position such that it was in a relatively same  position on her reconstructed breast as it is on her normal breast.  The  patient was then placed in a supine position.  A 45 mm diameter circle was  drawn around the chosen location for the nipple.  Within the confines of  this circle, a tripartite flap was marked with the three  points of the flap  pointing to the 3 o'clock, 9o'clock and 12 o'clock position.  There was an  inferiorly based random skin paddle for vascular integrity.  The left  reconstructed TRAM was prepped with Betadine and draped with sterile drapes.  The three points of the flap were incised and the flap was elevated down to  the 2 cm wide skin pedicle.  The tips on each  of the three flaps were  removed to create blunt ends of the flaps.  The donor site was closed by  using 4-0 PDS to unit the dermis of the donor sites.  The skin was closed in  the donor site using running 4-0 chromic suture.  The 3 o'clock and 9o'clock  flaps were rotated towards one another such that the blunt ends of the two  flaps met and these two blunt ends were fixed to each other using  interrupted 4-0 chromic suture.  The flap which was pointing to the 12  o'clock position was placed down on top of the created cylinder by uniting  the other two flaps which closed the cylinder.  This was fixed using 4-0  chromic suture.  This completed  the creation of the nipple.  It was  completely viable at the conclusion.  A bulky dressing was placed around the  nipple and the patient was discharged to home in satisfactory condition.                                               Alfredia Ferguson, M.D.    WBB/MEDQ  D:  03/03/2003  T:  03/03/2003  Job:  [id number]

## 2010-08-08 ENCOUNTER — Other Ambulatory Visit: Payer: Self-pay | Admitting: Obstetrics and Gynecology

## 2010-08-08 ENCOUNTER — Other Ambulatory Visit (HOSPITAL_COMMUNITY)
Admission: RE | Admit: 2010-08-08 | Discharge: 2010-08-08 | Disposition: A | Discharge status: Home / Self Care | Payer: Federal, State, Local not specified - PPO | Source: Ambulatory Visit | Attending: Obstetrics and Gynecology | Admitting: Obstetrics and Gynecology

## 2010-08-08 DIAGNOSIS — Z01419 Encounter for gynecological examination (general) (routine) without abnormal findings: Secondary | ICD-10-CM | POA: Insufficient documentation

## 2010-08-15 ENCOUNTER — Ambulatory Visit (INDEPENDENT_AMBULATORY_CARE_PROVIDER_SITE_OTHER): Payer: Federal, State, Local not specified - PPO | Admitting: Internal Medicine

## 2010-08-15 ENCOUNTER — Encounter: Payer: Self-pay | Admitting: Internal Medicine

## 2010-08-15 VITALS — BP 110/78 | Temp 98.4°F | Wt 159.0 lb

## 2010-08-15 DIAGNOSIS — R51 Headache: Secondary | ICD-10-CM

## 2010-08-15 DIAGNOSIS — M542 Cervicalgia: Secondary | ICD-10-CM

## 2010-08-15 MED ORDER — METHYLPREDNISOLONE ACETATE 40 MG/ML IJ SUSP
40.0000 mg | Freq: Once | INTRAMUSCULAR | Status: AC
  Administered 2010-08-15: 40 mg via INTRAMUSCULAR

## 2010-08-15 NOTE — Patient Instructions (Signed)
You  may move around, but avoid painful motions and activities.  Apply  heat  to the sore area for 15 to 20 minutes 3 or 4 times daily for the next two to 3 days.  Gentle stretching and range of motion Consider a gentle massage Consider wearing a soft cervical collar  Call if unimproved for  consideration of radiographs and possible physical therapy referral

## 2010-08-15 NOTE — Progress Notes (Signed)
  Subjective:    Patient ID: Kristen Burke, female    DOB: 1955/10/17, 55 y.o.   MRN: [id number]  HPI  55 year old patient who is seen today complaining of persistent left posterior neck pain associated with headaches. She has been using Aleve 2 twice daily as well as a bedtime dose of Flexeril. She was seen a few weeks ago and did receive 80 mg of Depo-Medrol. Symptoms are much worse on her workdays and much improved on her off days. Denies any radicular symptoms    Review of Systems  Neurological: Positive for headaches.       Objective:   Physical Exam  Musculoskeletal:       Full range of motion of the neck;  musculature today felt normal and did not appear to be particularly tight or tense          Assessment & Plan:   Neck pain and secondary headaches. We'll continue her present regimen.  Will try a more vigorous stretching and range of motion regimen. gentle massage and heat therapy. If not improved will consider radiographs and possible referral for physical therapy. A soft cervical collar was discussed but she does work in a warm environment

## 2010-08-22 ENCOUNTER — Ambulatory Visit: Payer: Federal, State, Local not specified - PPO

## 2010-08-22 ENCOUNTER — Other Ambulatory Visit: Payer: Federal, State, Local not specified - PPO

## 2010-09-05 ENCOUNTER — Ambulatory Visit
Admission: RE | Admit: 2010-09-05 | Discharge: 2010-09-05 | Disposition: A | Discharge status: Home / Self Care | Payer: Federal, State, Local not specified - PPO | Source: Ambulatory Visit | Attending: Oncology | Admitting: Oncology

## 2010-09-05 DIAGNOSIS — Z1231 Encounter for screening mammogram for malignant neoplasm of breast: Secondary | ICD-10-CM

## 2010-09-05 DIAGNOSIS — Z780 Asymptomatic menopausal state: Secondary | ICD-10-CM

## 2010-09-28 ENCOUNTER — Ambulatory Visit (INDEPENDENT_AMBULATORY_CARE_PROVIDER_SITE_OTHER): Payer: Federal, State, Local not specified - PPO | Admitting: Family Medicine

## 2010-09-28 ENCOUNTER — Encounter: Payer: Self-pay | Admitting: Family Medicine

## 2010-09-28 VITALS — BP 130/90 | Temp 99.0°F | Wt 162.0 lb

## 2010-09-28 DIAGNOSIS — M542 Cervicalgia: Secondary | ICD-10-CM

## 2010-09-28 DIAGNOSIS — R51 Headache: Secondary | ICD-10-CM

## 2010-09-28 NOTE — Progress Notes (Signed)
  Subjective:    Patient ID: Kristen Burke, female    DOB: December 08, 1955, 55 y.o.   MRN: [id number]  HPI 6 week history of upper back pain, left cervical neck pain and headaches which are relatively continuous. Has tried Advil, Flexeril, and Depo-Medrol injection without improvement. Symptoms are consistently left-sided and radiating occasionally to her shoulder but not below. Achy quality. 7/10 severity at worst. Headache is bilateral and occipital occasionally frontal. No alleviating factors. She's had some nausea but no vomiting. No reported injury. 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 No upper extremity weakness or numbness.  Patient had remote history of breast cancer 2004. No appetite or weight changes. Previous discussion of x-rays and physical therapy if no improvement  Past Medical History  Diagnosis Date  . BACK PAIN 11/17/2009  . GASTROENTERITIS 04/08/2008   No past surgical history on file.  reports that she quit smoking about 32 years ago. She does not have any smokeless tobacco history on file. Her alcohol and drug histories not on file. family history is not on file. Allergies  Allergen Reactions  . Sulfamethoxazole     REACTION: unspecified      Review of Systems  Constitutional: Negative for fever, chills and fatigue.  HENT: Positive for neck pain. Negative for neck stiffness.   Cardiovascular: Negative for chest pain.  Skin: Negative for rash.  Neurological: Positive for headaches. Negative for dizziness, syncope and weakness.  Hematological: Negative for adenopathy. Does not bruise/bleed easily.       Objective:   Physical Exam  Constitutional: She is oriented to person, place, and time. She appears well-developed and well-nourished.  HENT:  Head: Normocephalic and atraumatic.  Neck: Neck supple. No thyromegaly present.  Cardiovascular: Normal rate, regular rhythm and normal heart sounds.   Pulmonary/Chest: Effort normal  and breath sounds normal. No respiratory distress. She has no wheezes. She has no rales.  Musculoskeletal: She exhibits no edema.  Lymphadenopathy:    She has no cervical adenopathy.  Neurological: She is alert and oriented to person, place, and time. No cranial nerve deficit.       Full-strength upper extremities. Symmetric reflexes. Full range of motion left shoulder  Psychiatric: She has a normal mood and affect.          Assessment & Plan:  Persistent left cervical neck pain and headaches with nonfocal neurologic exam. Given duration set up cervical spine films and schedule physical therapy

## 2010-09-29 ENCOUNTER — Telehealth: Payer: Self-pay | Admitting: Internal Medicine

## 2010-09-29 ENCOUNTER — Ambulatory Visit (INDEPENDENT_AMBULATORY_CARE_PROVIDER_SITE_OTHER)
Admission: RE | Admit: 2010-09-29 | Discharge: 2010-09-29 | Disposition: A | Discharge status: Home / Self Care | Payer: Federal, State, Local not specified - PPO | Source: Ambulatory Visit | Attending: Family Medicine | Admitting: Family Medicine

## 2010-09-29 DIAGNOSIS — R51 Headache: Secondary | ICD-10-CM

## 2010-09-29 DIAGNOSIS — M542 Cervicalgia: Secondary | ICD-10-CM

## 2010-09-29 NOTE — Telephone Encounter (Signed)
Pt had received x-rays this morning and would like to know the out come of them

## 2010-09-29 NOTE — Progress Notes (Signed)
Quick Note:  Pt informed ______ 

## 2010-10-02 ENCOUNTER — Telehealth: Payer: Self-pay

## 2010-10-02 NOTE — Telephone Encounter (Signed)
Opened in error

## 2010-10-02 NOTE — Telephone Encounter (Signed)
Results on desk - dr. Caryl Never saw 7/26 - xray 7/27 - they have notified pt - no further action needed- old call kik

## 2010-10-02 NOTE — Telephone Encounter (Signed)
Please advise 

## 2010-10-02 NOTE — Telephone Encounter (Signed)
Please obtain results of these x-rays

## 2011-03-24 ENCOUNTER — Telehealth: Payer: Self-pay | Admitting: Oncology

## 2011-03-24 NOTE — Telephone Encounter (Signed)
lmonvm adviising the pt of her r/s appts from feb to march due to the epic conversion

## 2011-04-12 ENCOUNTER — Encounter: Payer: Self-pay | Admitting: Family

## 2011-04-12 ENCOUNTER — Ambulatory Visit (INDEPENDENT_AMBULATORY_CARE_PROVIDER_SITE_OTHER): Payer: Federal, State, Local not specified - PPO | Admitting: Family

## 2011-04-12 VITALS — BP 142/100 | Temp 98.8°F | Wt 162.0 lb

## 2011-04-12 DIAGNOSIS — R197 Diarrhea, unspecified: Secondary | ICD-10-CM

## 2011-04-12 LAB — CBC WITH DIFFERENTIAL/PLATELET
Basophils Relative: 0.4 % (ref 0.0–3.0)
Eosinophils Absolute: 0 10*3/uL (ref 0.0–0.7)
HCT: 45.3 % (ref 36.0–46.0)
Lymphs Abs: 1.3 10*3/uL (ref 0.7–4.0)
MCHC: 34.8 g/dL (ref 30.0–36.0)
MCV: 90.9 fl (ref 78.0–100.0)
Monocytes Absolute: 0.5 10*3/uL (ref 0.1–1.0)
Neutrophils Relative %: 67.7 % (ref 43.0–77.0)
Platelets: 263 10*3/uL (ref 150.0–400.0)

## 2011-04-12 LAB — BASIC METABOLIC PANEL
BUN: 16 mg/dL (ref 6–23)
CO2: 24 mEq/L (ref 19–32)
Chloride: 104 mEq/L (ref 96–112)
Creatinine, Ser: 0.6 mg/dL (ref 0.4–1.2)
Potassium: 4.2 mEq/L (ref 3.5–5.1)

## 2011-04-12 MED ORDER — DIPHENOXYLATE-ATROPINE 2.5-0.025 MG PO TABS: 1.0000 | ORAL_TABLET | Freq: Four times a day (QID) | ORAL | Status: AC | PRN

## 2011-04-12 NOTE — Patient Instructions (Signed)
Diarrhea Infections caused by germs (bacterial) or a virus commonly cause diarrhea. Your caregiver has determined that with time, rest and fluids, the diarrhea should improve. In general, eat normally while drinking more water than usual. Although water may prevent dehydration, it does not contain salt and minerals (electrolytes). Broths, weak tea without caffeine and oral rehydration solutions (ORS) replace fluids and electrolytes. Small amounts of fluids should be taken frequently. Large amounts at one time may not be tolerated. Plain water may be harmful in infants and the elderly. Oral rehydrating solutions (ORS) are available at pharmacies and grocery stores. ORS replace water and important electrolytes in proper proportions. Sports drinks are not as effective as ORS and may be harmful due to sugars worsening diarrhea.  ORS is especially recommended for use in children with diarrhea. As a general guideline for children, replace any new fluid losses from diarrhea and/or vomiting with ORS as follows:   If your child weighs 22 pounds or under (10 kg or less), give 60-120 mL ( -  cup or 2 - 4 ounces) of ORS for each episode of diarrheal stool or vomiting episode.   If your child weighs more than 22 pounds (more than 10 kgs), give 120-240 mL ( - 1 cup or 4 - 8 ounces) of ORS for each diarrheal stool or episode of vomiting.   While correcting for dehydration, children should eat normally. However, foods high in sugar should be avoided because this may worsen diarrhea. Large amounts of carbonated soft drinks, juice, gelatin desserts and other highly sugared drinks should be avoided.   After correction of dehydration, other liquids that are appealing to the child may be added. Children should drink small amounts of fluids frequently and fluids should be increased as tolerated. Children should drink enough fluids to keep urine clear or pale yellow.   Adults should eat normally while drinking more fluids  than usual. Drink small amounts of fluids frequently and increase as tolerated. Drink enough fluids to keep urine clear or pale yellow. Broths, weak decaffeinated tea, lemon lime soft drinks (allowed to go flat) and ORS replace fluids and electrolytes.   Avoid:   Carbonated drinks.   Juice.   Extremely hot or cold fluids.   Caffeine drinks.   Fatty, greasy foods.   Alcohol.   Tobacco.   Too much intake of anything at one time.   Gelatin desserts.   Probiotics are active cultures of beneficial bacteria. They may lessen the amount and number of diarrheal stools in adults. Probiotics can be found in yogurt with active cultures and in supplements.   Wash hands well to avoid spreading bacteria and virus.   Anti-diarrheal medications are not recommended for infants and children.   Only take over-the-counter or prescription medicines for pain, discomfort or fever as directed by your caregiver. Do not give aspirin to children because it may cause Reye's Syndrome.   For adults, ask your caregiver if you should continue all prescribed and over-the-counter medicines.   If your caregiver has given you a follow-up appointment, it is very important to keep that appointment. Not keeping the appointment could result in a chronic or permanent injury, and disability. If there is any problem keeping the appointment, you must call back to this facility for assistance.  SEEK IMMEDIATE MEDICAL CARE IF:   You or your child is unable to keep fluids down or other symptoms or problems become worse in spite of treatment.   Vomiting or diarrhea develops and becomes persistent.     There is vomiting of blood or bile (green material).   There is blood in the stool or the stools are black and tarry.   There is no urine output in 6-8 hours or there is only a small amount of very dark urine.   Abdominal pain develops, increases or localizes.   You have a fever.   Your baby is older than 3 months with a  rectal temperature of 102 F (38.9 C) or higher.   Your baby is 3 months old or younger with a rectal temperature of 100.4 F (38 C) or higher.   You or your child develops excessive weakness, dizziness, fainting or extreme thirst.   You or your child develops a rash, stiff neck, severe headache or become irritable or sleepy and difficult to awaken.  MAKE SURE YOU:   Understand these instructions.   Will watch your condition.   Will get help right away if you are not doing well or get worse.  Document Released: 02/09/2002 Document Revised: 11/01/2010 Document Reviewed: 12/27/2008 Exit[employer] Patient Information 2012 [employer]. 

## 2011-04-12 NOTE — Progress Notes (Signed)
  Subjective:    Patient ID: Kristen Burke, female    DOB: 10/10/1955, 56 y.o.   MRN: [id number]  HPI Comments: C/o diarrhea new onset 3-4 months ago. Denies recent travel, sick family, nausea, vomiting, ab pain, loss of appetite, change in diet, or taking any new meds or herbal supplements. Described stool color light brown with no visible blood. C/o ab fullness when diarrhea occurs. Colonoscopy in 2011 normal. Has not seen GI.   Diarrhea  Pertinent negatives include no abdominal pain.      Review of Systems  Constitutional: Negative.   Respiratory: Negative.   Cardiovascular: Negative.   Gastrointestinal: Positive for diarrhea. Negative for abdominal pain, constipation, blood in stool, abdominal distention and anal bleeding.   Past Medical History  Diagnosis Date  . BACK PAIN 11/17/2009  . GASTROENTERITIS 04/08/2008    History   Social History  . Marital Status: Married    Spouse Name: N/A    Number of Children: N/A  . Years of Education: N/A   Occupational History  . Not on file.   Social History Main Topics  . Smoking status: Former Smoker    Quit date: 03/05/1978  . Smokeless tobacco: Not on file  . Alcohol Use: Not on file  . Drug Use: Not on file  . Sexually Active: Not on file   Other Topics Concern  . Not on file   Social History Narrative  . No narrative on file    No past surgical history on file.  No family history on file.  Allergies  Allergen Reactions  . Sulfamethoxazole     REACTION: unspecified    Current Outpatient Prescriptions on File Prior to Visit  Medication Sig Dispense Refill  . cyclobenzaprine (FLEXERIL) 10 MG tablet Take 1 tablet (10 mg total) by mouth 3 (three) times daily as needed. mucsle spasm  30 tablet  1  . LEXAPRO 10 MG tablet         BP 142/100  Temp(Src) 98.8 F (37.1 C) (Oral)  Wt 162 lb (73.483 kg)chart     Objective:   Physical Exam  Constitutional: She is oriented to person, place, and time. She appears  well-developed and well-nourished. No distress.  Cardiovascular: Normal rate, regular rhythm, normal heart sounds and intact distal pulses.  Exam reveals no gallop and no friction rub.   No murmur heard. Pulmonary/Chest: Effort normal and breath sounds normal. No respiratory distress. She has no wheezes. She exhibits no tenderness.  Abdominal: Soft. Bowel sounds are normal. She exhibits no distension and no mass. There is no tenderness. There is no rebound and no guarding.  Neurological: She is alert and oriented to person, place, and time.  Skin: Skin is warm and dry. She is not diaphoretic. No pallor.          Assessment & Plan:  Assessment: Diarrhea  Plan: CBC, BMP, stool c and s, GI consult, lomotil prn, RTC if s/s worsen.

## 2011-04-17 ENCOUNTER — Other Ambulatory Visit: Payer: Federal, State, Local not specified - PPO

## 2011-04-17 DIAGNOSIS — Z0279 Encounter for issue of other medical certificate: Secondary | ICD-10-CM

## 2011-04-17 LAB — STOOL CULTURE

## 2011-05-04 ENCOUNTER — Other Ambulatory Visit: Payer: Federal, State, Local not specified - PPO | Admitting: Lab

## 2011-05-08 ENCOUNTER — Ambulatory Visit: Payer: Federal, State, Local not specified - PPO | Admitting: Oncology

## 2011-05-15 ENCOUNTER — Encounter: Payer: Self-pay | Admitting: Gastroenterology

## 2011-06-06 ENCOUNTER — Encounter: Payer: Self-pay | Admitting: Gastroenterology

## 2011-06-06 ENCOUNTER — Other Ambulatory Visit (INDEPENDENT_AMBULATORY_CARE_PROVIDER_SITE_OTHER): Payer: Federal, State, Local not specified - PPO

## 2011-06-06 ENCOUNTER — Ambulatory Visit (INDEPENDENT_AMBULATORY_CARE_PROVIDER_SITE_OTHER): Payer: Federal, State, Local not specified - PPO | Admitting: Gastroenterology

## 2011-06-06 VITALS — BP 120/72 | HR 80 | Ht 65.0 in | Wt 161.0 lb

## 2011-06-06 DIAGNOSIS — R197 Diarrhea, unspecified: Secondary | ICD-10-CM

## 2011-06-06 DIAGNOSIS — Z86010 Personal history of colonic polyps: Secondary | ICD-10-CM

## 2011-06-06 DIAGNOSIS — R198 Other specified symptoms and signs involving the digestive system and abdomen: Secondary | ICD-10-CM

## 2011-06-06 LAB — HEPATIC FUNCTION PANEL
Bilirubin, Direct: 0.1 mg/dL (ref 0.0–0.3)
Total Protein: 7.5 g/dL (ref 6.0–8.3)

## 2011-06-06 MED ORDER — ALIGN 4 MG PO CAPS: 1.0000 | ORAL_CAPSULE | Freq: Every day | ORAL | Status: AC

## 2011-06-06 NOTE — Patient Instructions (Signed)
Your physician has requested that you go to the basement for the following lab work before leaving today: Hepatic Function Panel, Celiac panel. We have given you samples of Align. This puts good bacteria back into your intestines. You should take 1 capsule by mouth once daily. If this works well for you, it can be purchased over the counter. You will be due for a recall colonoscopy in 09/2014. We will send you a reminder in the mail when it gets closer to that time. cc: Eleonore Chiquito, MD

## 2011-06-06 NOTE — Progress Notes (Signed)
History of Present Illness: This is a 56 year old female with a long history of constipation and a prior diagnosis of constipation predominant irritable bowel syndrome. She previously saw Dr. Loreta Ave and underwent colonoscopy in July 2011 showing scattered sigmoid diverticulosis and a right colon adenomatous colon polyp. Over the past 6-7 months she now has alternating diarrhea and constipation. She discontinued diet sodas and her symptoms improved, but persistent. She states about 3 times each month she will have significant urgent diarrhea with her typical bowel pattern of constipated than normal stools. She notes that salads tend to lead to diarrhea however she denies denies any other specific dietary precipitants. She denies any diet or medication changes. Recent blood work and stool studies unremarkable. Denies weight loss, abdominal pain, change in stool caliber, melena, hematochezia, nausea, vomiting, dysphagia, reflux symptoms, chest pain.  Review of Systems: Pertinent positive and negative review of systems were noted in the above HPI section. All other review of systems were otherwise negative.  Current Medications, Allergies, Past Medical History, Past Surgical History, Family History and Social History were reviewed in [employer] record.  Physical Exam: General: Well developed , well nourished, no acute distress Head: Normocephalic and atraumatic Eyes:  sclerae anicteric, EOMI Ears: Normal auditory acuity Mouth: No deformity or lesions Neck: Supple, no masses or thyromegaly Lungs: Clear throughout to auscultation Heart: Regular rate and rhythm; no murmurs, rubs or bruits Abdomen: Soft, non tender and non distended. No masses, hepatosplenomegaly or hernias noted. Normal Bowel sounds Musculoskeletal: Symmetrical with no gross deformities  Skin: No lesions on visible extremities Pulses:  Normal pulses noted Extremities: No clubbing, cyanosis, edema or deformities  noted Neurological: Alert oriented x 4, grossly nonfocal Cervical Nodes:  No significant cervical adenopathy Inguinal Nodes: No significant inguinal adenopathy Psychological:  Alert and cooperative. Normal mood and affect  Assessment and Recommendations:  1. Presumed irritable bowel syndrome that has entered an alternating phase. Rule out dietary stressors leading to looser stools. Rule out celiac disease. Obtain a celiac panel. Trial of probiotics. Avoid high fat foods and minimize raw fruits and vegetables.  Avoid diet gum and diet soda.   2. Personal history of adenomatous colon polyps. Surveillance colonoscopy recommended July 2016.

## 2011-06-07 LAB — CELIAC PANEL 10
Endomysial Screen: NEGATIVE
IgA: 288 mg/dL (ref 69–380)
Tissue Transglut Ab: 5.7 U/mL (ref <20)
Tissue Transglutaminase Ab, IgA: 3.4 U/mL (ref <20)

## 2011-06-20 ENCOUNTER — Other Ambulatory Visit (HOSPITAL_BASED_OUTPATIENT_CLINIC_OR_DEPARTMENT_OTHER): Payer: Federal, State, Local not specified - PPO | Admitting: Lab

## 2011-06-20 DIAGNOSIS — C50419 Malignant neoplasm of upper-outer quadrant of unspecified female breast: Secondary | ICD-10-CM

## 2011-06-20 LAB — CBC WITH DIFFERENTIAL/PLATELET
BASO%: 0.2 % (ref 0.0–2.0)
Eosinophils Absolute: 0.1 10*3/uL (ref 0.0–0.5)
LYMPH%: 20.8 % (ref 14.0–49.7)
MCHC: 34.2 g/dL (ref 31.5–36.0)
MONO#: 0.5 10*3/uL (ref 0.1–0.9)
NEUT#: 4.4 10*3/uL (ref 1.5–6.5)
Platelets: 260 10*3/uL (ref 145–400)
RBC: 5.14 10*6/uL (ref 3.70–5.45)
RDW: 12.6 % (ref 11.2–14.5)
WBC: 6.3 10*3/uL (ref 3.9–10.3)
nRBC: 0 % (ref 0–0)

## 2011-06-21 LAB — COMPREHENSIVE METABOLIC PANEL
ALT: 27 U/L (ref 0–35)
AST: 28 U/L (ref 0–37)
Calcium: 10 mg/dL (ref 8.4–10.5)
Chloride: 105 mEq/L (ref 96–112)
Creatinine, Ser: 0.76 mg/dL (ref 0.50–1.10)
Sodium: 140 mEq/L (ref 135–145)
Total Bilirubin: 0.4 mg/dL (ref 0.3–1.2)
Total Protein: 7.4 g/dL (ref 6.0–8.3)

## 2011-06-27 ENCOUNTER — Telehealth: Payer: Self-pay | Admitting: *Deleted

## 2011-06-27 ENCOUNTER — Telehealth: Payer: Self-pay | Admitting: Oncology

## 2011-06-27 ENCOUNTER — Ambulatory Visit (HOSPITAL_BASED_OUTPATIENT_CLINIC_OR_DEPARTMENT_OTHER): Payer: Federal, State, Local not specified - PPO | Admitting: Oncology

## 2011-06-27 VITALS — BP 125/89 | HR 101 | Temp 98.5°F | Ht 65.0 in | Wt 160.3 lb

## 2011-06-27 DIAGNOSIS — M810 Age-related osteoporosis without current pathological fracture: Secondary | ICD-10-CM

## 2011-06-27 DIAGNOSIS — Z853 Personal history of malignant neoplasm of breast: Secondary | ICD-10-CM

## 2011-06-27 DIAGNOSIS — Z1231 Encounter for screening mammogram for malignant neoplasm of breast: Secondary | ICD-10-CM

## 2011-06-27 DIAGNOSIS — C50919 Malignant neoplasm of unspecified site of unspecified female breast: Secondary | ICD-10-CM

## 2011-06-27 DIAGNOSIS — E559 Vitamin D deficiency, unspecified: Secondary | ICD-10-CM

## 2011-06-27 MED ORDER — ALENDRONATE SODIUM 70 MG PO TABS: 70.0000 mg | ORAL_TABLET | ORAL | Status: DC

## 2011-06-27 NOTE — Progress Notes (Signed)
Hematology and Oncology Follow Up Visit  Kristen Burke [id number] 09-Oct-1955 56 y.o. 06/27/2011 1:31 PM PCP Dr Lesia Hausen  Principle Diagnosis: 56 year old woman with history of stage II breast cancer status post left modified radical mastectomy, TRAM flap reconstruction followed by 6 cycles of FEC chemotherapy completed November 04 present time on tamoxifen, followed by Femara, but discontinued after 6 months.  Interim History:  There have been no intercurrent illness, hospitalizations or medication changes. She has been doing well. She's had some problems with her bowel syndrome and had a colonoscopy which showed a small polyp. She has not had genetic testing after being seen 6 months ago. She did have a bone density test which did show osteoporosis. She is due for followup mammogram in July.  Medications: I have reviewed the patient's current medications.  Allergies:  Allergies  Allergen Reactions  . Sulfamethoxazole     REACTION: unspecified    Past Medical History, Surgical history, Social history, and Family History were reviewed and updated.  Review of Systems: Constitutional:  Negative for fever, chills, night sweats, anorexia, weight loss, pain. Cardiovascular: no chest pain or dyspnea on exertion Respiratory: no cough, shortness of breath, or wheezing Neurological: no TIA or stroke symptoms Dermatological: negative ENT: negative Skin Gastrointestinal: negative Genito-Urinary: negative Hematological and Lymphatic: negative Breast: negative Musculoskeletal: negative Remaining ROS negative.  Physical Exam: Blood pressure 125/89, pulse 101, temperature 98.5 F (36.9 C), height 5\' 5"  (1.651 m), weight 160 lb 4.8 oz (72.712 kg). ECOG: 0 General appearance: alert, cooperative and appears stated age Head: Normocephalic, without obvious abnormality, atraumatic Neck: no adenopathy, no carotid bruit, no JVD, supple, symmetrical, trachea midline and thyroid not enlarged,  symmetric, no tenderness/mass/nodules Lymph nodes: Cervical, supraclavicular, and axillary nodes normal. Cardiac : regular rate and rhythm, no murmurs or gallops Pulmonary:clear to auscultation bilaterally and normal percussion bilaterally Breasts: inspection negative, no nipple discharge or bleeding, no masses or nodularity palpable, status post left mastectomy and TRAM flap reconstruction. No evidence of local recurrence Abdomen:soft, non-tender; bowel sounds normal; no masses,  no organomegaly Extremities negative Neuro: alert, oriented, normal speech, no focal findings or movement disorder noted  Lab Results: Lab Results  Component Value Date   WBC 6.3 06/20/2011   HGB 15.9 06/20/2011   HCT 46.4 06/20/2011   MCV 90.2 06/20/2011   PLT 260 06/20/2011     Chemistry      Component Value Date/Time   NA 140 06/20/2011 0949   K 4.1 06/20/2011 0949   CL 105 06/20/2011 0949   CO2 24 06/20/2011 0949   BUN 19 06/20/2011 0949   CREATININE 0.76 06/20/2011 0949      Component Value Date/Time   CALCIUM 10.0 06/20/2011 0949   ALKPHOS 99 06/20/2011 0949   AST 28 06/20/2011 0949   ALT 27 06/20/2011 0949   BILITOT 0.4 06/20/2011 0949      .pathology. Radiological Studies: chest X-ray n/a Mammogram Due 7/13 Bone density As above  Impression and Plan: With history of breast cancer now close 10 years from diagnosis. She is doing well. I recommend she start Fosamax because of the osteoporosis and increase your vitamin D intake. In addition I have tried to convince her to get genetic testing done to her age diagnosis and a positive family history as well as the fact she has 2 young adult daughters. I plan to see her in a years time and likely discharge her from practice at that point. More than 50% of the visit was spent in patient-related  counselling   Pierce Crane, MD 4/24/20131:31 PM

## 2011-06-27 NOTE — Telephone Encounter (Signed)
Left message for patient to return my call so I can schedule her for genetics.

## 2011-06-27 NOTE — Patient Instructions (Signed)
  WE WILL START FOSAMAX 70 MG/DAY WE WILL SCHEDULE GENETIC TESTING YOU WILL TAKE 2000 UNITS OF VIAMIN d3 PER DAY

## 2011-06-27 NOTE — Telephone Encounter (Signed)
gve the pt her April 2014 appt calendar along with the mammo appt. gve the genentic referral to lisa m.

## 2011-06-27 NOTE — Telephone Encounter (Signed)
Confirmed 07/26/11 genetics appt w/ pt. 

## 2011-07-10 ENCOUNTER — Encounter: Payer: Self-pay | Admitting: Family

## 2011-07-10 ENCOUNTER — Ambulatory Visit (INDEPENDENT_AMBULATORY_CARE_PROVIDER_SITE_OTHER): Payer: Federal, State, Local not specified - PPO | Admitting: Family

## 2011-07-10 VITALS — BP 130/84 | Temp 97.8°F | Wt 158.0 lb

## 2011-07-10 DIAGNOSIS — L259 Unspecified contact dermatitis, unspecified cause: Secondary | ICD-10-CM

## 2011-07-10 DIAGNOSIS — L282 Other prurigo: Secondary | ICD-10-CM

## 2011-07-10 MED ORDER — METHYLPREDNISOLONE ACETATE 80 MG/ML IJ SUSP
80.0000 mg | Freq: Once | INTRAMUSCULAR | Status: DC
  Administered 2011-07-10: 80 mg via INTRAMUSCULAR

## 2011-07-10 MED ORDER — METHYLPREDNISOLONE 4 MG PO KIT: PACK | ORAL | Status: AC

## 2011-07-10 MED ORDER — METHYLPREDNISOLONE ACETATE 40 MG/ML IJ SUSP: 80.0000 mg | Freq: Once | INTRAMUSCULAR | Status: DC

## 2011-07-10 NOTE — Progress Notes (Signed)
Subjective:    Patient ID: Kristen Burke, female    DOB: [birth year], 56 y.o.   MRN: [id number]  HPI 56 year old white female, nonsmoker, patient of Dr. Kirtland Bouchard. is in today with complaints of a rash all over her body x1 day. Patient will arouse from her sleep this morning with is itchy, red, whelped appearing rash. She denies any changes in detergents, soaps, or lotions. She has not taken any medication for her symptoms.   Review of Systems  Constitutional: Negative.   Respiratory: Negative.   Cardiovascular: Negative.   Gastrointestinal: Negative.   Genitourinary: Negative.   Musculoskeletal: Negative.   Skin: Positive for rash.       Generalized, red, body rash x1 day  Neurological: Negative.   Hematological: Negative.   Psychiatric/Behavioral: Negative.    Past Medical History  Diagnosis Date  . Breast cancer   . Diverticulosis   . Tubular adenoma of colon 09/2009    History   Social History  . Marital Status: Married    Spouse Name: N/A    Number of Children: 2  . Years of Education: N/A   Occupational History  . [employer]   Social History Main Topics  . Smoking status: Former Smoker    Quit date: 03/05/1978  . Smokeless tobacco: Never Used  . Alcohol Use: No  . Drug Use: No  . Sexually Active: Not on file   Other Topics Concern  . Not on file   Social History Narrative   Daily caffeine    Past Surgical History  Procedure Date  . Mastectomy, radical 2004    Modified Left  . Tram 2004    Family History  Problem Relation Age of Onset  . Heart attack Father   . Breast cancer Mother   . Colon cancer Neg Hx     Allergies  Allergen Reactions  . Sulfamethoxazole     REACTION: unspecified    Current Outpatient Prescriptions on File Prior to Visit  Medication Sig Dispense Refill  . alendronate (FOSAMAX) 70 MG tablet Take 1 tablet (70 mg total) by mouth every 7 (seven) days. Take with a full glass of water on an empty stomach.  12 tablet  5   . calcium carbonate 200 MG capsule Take 250 mg by mouth once a week.      . diphenoxylate-atropine (LOMOTIL) 2.5-0.025 MG per tablet Will use as needed up to four a day      . Probiotic Product (ALIGN) 4 MG CAPS Take 1 capsule by mouth daily.  14 capsule  0   No current facility-administered medications on file prior to visit.    BP 130/84  Temp(Src) 97.8 F (36.6 C) (Oral)  Wt 158 lb (71.668 kg)chart    Objective:   Physical Exam  Constitutional: She appears well-developed and well-nourished.  Neck: Normal range of motion. Neck supple.  Cardiovascular: Normal rate, regular rhythm and normal heart sounds.   Pulmonary/Chest: Effort normal and breath sounds normal.  Neurological: She is alert.  Skin: Skin is warm and dry. Rash noted.       Papular, red, whelped appearing rash noted to the arms bilaterally, trunk, legs bilaterally. No drainage or discharge. Minimal excoriation noted  Psychiatric: She has a normal mood and affect.      Depo-Medrol 80 mg IM x1.    Assessment & Plan:  Assessment: Contact dermatitis, pruritus  Plan: Medrol Dosepak as directed. Drink plenty of fluids. Patient call the office if symptoms worsen  or persist. Recheck a schedule, and when necessary.

## 2011-07-10 NOTE — Patient Instructions (Signed)

## 2011-07-11 ENCOUNTER — Ambulatory Visit: Payer: Federal, State, Local not specified - PPO | Admitting: Gastroenterology

## 2011-07-26 ENCOUNTER — Encounter: Payer: Federal, State, Local not specified - PPO | Admitting: Genetic Counselor

## 2011-07-26 ENCOUNTER — Other Ambulatory Visit: Payer: Federal, State, Local not specified - PPO | Admitting: Lab

## 2011-08-08 ENCOUNTER — Other Ambulatory Visit (HOSPITAL_COMMUNITY)
Admission: RE | Admit: 2011-08-08 | Discharge: 2011-08-08 | Disposition: A | Discharge status: Home / Self Care | Payer: Federal, State, Local not specified - PPO | Source: Ambulatory Visit | Attending: Obstetrics and Gynecology | Admitting: Obstetrics and Gynecology

## 2011-08-08 ENCOUNTER — Other Ambulatory Visit: Payer: Self-pay | Admitting: Obstetrics and Gynecology

## 2011-08-08 DIAGNOSIS — Z01419 Encounter for gynecological examination (general) (routine) without abnormal findings: Secondary | ICD-10-CM | POA: Insufficient documentation

## 2011-08-08 DIAGNOSIS — Z1159 Encounter for screening for other viral diseases: Secondary | ICD-10-CM | POA: Insufficient documentation

## 2011-09-26 ENCOUNTER — Ambulatory Visit
Admission: RE | Admit: 2011-09-26 | Discharge: 2011-09-26 | Disposition: A | Discharge status: Home / Self Care | Payer: Federal, State, Local not specified - PPO | Source: Ambulatory Visit | Attending: Oncology | Admitting: Oncology

## 2011-09-26 DIAGNOSIS — Z1231 Encounter for screening mammogram for malignant neoplasm of breast: Secondary | ICD-10-CM

## 2011-11-23 ENCOUNTER — Other Ambulatory Visit: Payer: Self-pay

## 2011-11-23 MED ORDER — DIPHENOXYLATE-ATROPINE 2.5-0.025 MG PO TABS: 1.0000 | ORAL_TABLET | Freq: Four times a day (QID) | ORAL | Status: DC | PRN

## 2011-11-23 NOTE — Telephone Encounter (Signed)
Called in.

## 2011-11-23 NOTE — Telephone Encounter (Signed)
ok 

## 2011-11-23 NOTE — Telephone Encounter (Signed)
Fax refill for lomotil  Last seen by you 08/2010 padonda saw 07/10/11  Last written 04/12/11 # 30 by padonda  - please advise on RF

## 2012-02-13 ENCOUNTER — Other Ambulatory Visit: Payer: Self-pay | Admitting: Internal Medicine

## 2012-04-25 ENCOUNTER — Telehealth: Payer: Self-pay | Admitting: *Deleted

## 2012-04-25 NOTE — Telephone Encounter (Signed)
Left vm for pt to return call to r/s f/u appt. 

## 2012-05-06 ENCOUNTER — Encounter: Payer: Self-pay | Admitting: *Deleted

## 2012-05-06 NOTE — Progress Notes (Signed)
Left message, Mailed letter, Awaiting pt response.  

## 2012-05-06 NOTE — Progress Notes (Signed)
Awaiting patient response I have cancelled her appts. 

## 2012-06-04 ENCOUNTER — Telehealth: Payer: Self-pay | Admitting: *Deleted

## 2012-06-04 NOTE — Telephone Encounter (Signed)
Pt called and left me a message to reschedule her appt and I called and left her a message to call me back so I can do so.

## 2012-06-24 ENCOUNTER — Other Ambulatory Visit: Payer: Federal, State, Local not specified - PPO | Admitting: Lab

## 2012-06-25 ENCOUNTER — Encounter: Payer: Self-pay | Admitting: Oncology

## 2012-06-25 ENCOUNTER — Encounter: Payer: Self-pay | Admitting: *Deleted

## 2012-06-25 ENCOUNTER — Telehealth: Payer: Self-pay | Admitting: *Deleted

## 2012-06-25 NOTE — Telephone Encounter (Signed)
Pt returned my call and I confirmed 07/09/12 appt w/ pt.  Mailed letter & calendar to pt.

## 2012-07-01 ENCOUNTER — Ambulatory Visit: Payer: Federal, State, Local not specified - PPO | Admitting: Oncology

## 2012-07-09 ENCOUNTER — Ambulatory Visit (HOSPITAL_BASED_OUTPATIENT_CLINIC_OR_DEPARTMENT_OTHER): Payer: Federal, State, Local not specified - PPO | Admitting: Oncology

## 2012-07-09 ENCOUNTER — Telehealth: Payer: Self-pay | Admitting: Oncology

## 2012-07-09 ENCOUNTER — Encounter: Payer: Self-pay | Admitting: Oncology

## 2012-07-09 VITALS — BP 141/92 | HR 100 | Temp 98.0°F | Resp 20 | Ht 65.0 in | Wt 158.3 lb

## 2012-07-09 DIAGNOSIS — C50912 Malignant neoplasm of unspecified site of left female breast: Secondary | ICD-10-CM

## 2012-07-09 DIAGNOSIS — Z853 Personal history of malignant neoplasm of breast: Secondary | ICD-10-CM

## 2012-07-09 DIAGNOSIS — M810 Age-related osteoporosis without current pathological fracture: Secondary | ICD-10-CM

## 2012-07-09 DIAGNOSIS — Z9010 Acquired absence of unspecified breast and nipple: Secondary | ICD-10-CM

## 2012-07-09 DIAGNOSIS — Z9012 Acquired absence of left breast and nipple: Secondary | ICD-10-CM

## 2012-07-09 DIAGNOSIS — Z1231 Encounter for screening mammogram for malignant neoplasm of breast: Secondary | ICD-10-CM

## 2012-07-09 DIAGNOSIS — M1990 Unspecified osteoarthritis, unspecified site: Secondary | ICD-10-CM

## 2012-07-09 HISTORY — DX: Unspecified osteoarthritis, unspecified site: M19.90

## 2012-07-09 NOTE — Patient Instructions (Addendum)
Doing well  I will see you back in 1 year 

## 2012-07-09 NOTE — Progress Notes (Signed)
OFFICE PROGRESS NOTE  CC  Rogelia Boga, MD [address] [place] [address]  DIAGNOSIS: 57 year old female with history of stage II breast cancer diagnosed 2004.  PRIOR THERAPY:  #1 patient was diagnosed with stage II ER positive left breast cancer in 2004. She underwent a modified radical mastectomy of the left breast followed by TRAM flap reconstruction.  #2 she then received 6 cycles of FEC chemotherapy which she completed in November 2004.  #3 because her tumor was ER positive she was begun on 5 years of tamoxifen. After that she was switched to Femara but after 6 months she discontinued it.  CURRENT THERAPY: Observation  INTERVAL HISTORY: Kristen Burke 57 y.o. female returns for followup visit today. She is transferring her care from Dr. Donnie Coffin to myself since Dr. Donnie Coffin is no longer practicing. Clinically she seems to be doing well she has no problems she denies any fevers chills night sweats headaches. She has no nausea vomiting. Remainder of the 10 point review of systems is negative.  MEDICAL HISTORY: Past Medical History  Diagnosis Date  . Breast cancer   . Diverticulosis   . Tubular adenoma of colon 09/2009  . Breast cancer, left breast 06/04/2002    Left breast  . Arthritis 07/09/2012    ALLERGIES:  is allergic to sulfamethoxazole.  MEDICATIONS:  Current Outpatient Prescriptions  Medication Sig Dispense Refill  . alendronate (FOSAMAX) 70 MG tablet       . calcium carbonate 200 MG capsule Take 250 mg by mouth once a week.      Marland Kitchen OVER THE COUNTER MEDICATION daily.      . Probiotic Product (ALIGN) 4 MG CAPS Take 1 capsule by mouth daily.  14 capsule  0  . diphenoxylate-atropine (LOMOTIL) 2.5-0.025 MG per tablet Take 1 tablet by mouth 4 (four) times daily as needed for diarrhea or loose stools. Will use as needed up to four a day  30 tablet  0   Current Facility-Administered Medications  Medication Dose Route Frequency Provider Last Rate Last Dose   . methylPREDNISolone acetate (DEPO-MEDROL) injection 80 mg  80 mg Intramuscular Once Baker Pierini, FNP        SURGICAL HISTORY:  Past Surgical History  Procedure Laterality Date  . Mastectomy, radical  2004    Modified Left  . Tram  2004    REVIEW OF SYSTEMS:  Pertinent items are noted in HPI.   HEALTH MAINTENANCE:   PHYSICAL EXAMINATION: Blood pressure 141/92, pulse 100, temperature 98 F (36.7 C), temperature source Oral, resp. rate 20, height 5\' 5"  (1.651 m), weight 158 lb 4.8 oz (71.804 kg). Body mass index is 26.34 kg/(m^2).  ECOG PERFORMANCE STATUS: 0 - Asymptomatic  Well-developed nourished female in no acute distress HEENT exam: EOMI PERRLA sclerae anicteric no conjunctival pallor oral mucosa is moist neck is supple lungs are clear to auscultation and percussion cardiovascular is regular rate rhythm abdomen is soft nontender nondistended bowel sounds are present no HSM extremities no edema neuro patient's alert oriented otherwise nonfocal left reconstructed breast no masses nipple discharge right breast no masses or nipple discharge   LABORATORY DATA: Lab Results  Component Value Date   WBC 6.3 06/20/2011   HGB 15.9 06/20/2011   HCT 46.4 06/20/2011   MCV 90.2 06/20/2011   PLT 260 06/20/2011      Chemistry      Component Value Date/Time   NA 140 06/20/2011 0949   K 4.1 06/20/2011 0949   CL 105 06/20/2011  0949   CO2 24 06/20/2011 0949   BUN 19 06/20/2011 0949   CREATININE 0.76 06/20/2011 0949      Component Value Date/Time   CALCIUM 10.0 06/20/2011 0949   ALKPHOS 99 06/20/2011 0949   AST 28 06/20/2011 0949   ALT 27 06/20/2011 0949   BILITOT 0.4 06/20/2011 0949       RADIOGRAPHIC STUDIES:  No results found.  ASSESSMENT: 57 year old female with  #1 history of stage II breast cancer status post left modified radical mastectomy with TRAM flap reconstruction. She received adjuvant chemotherapy consisting of FEC which she completed in November 2004. She was then  given 5 years of tamoxifen. She was then switched to Femara for 6 months but unfortunately could not tolerate it and so this was discontinued. She has been seen on a yearly basis. She has no evidence of recurrent disease clinically. Patient is due for mammograms in August.   PLAN:   #1 proceed with annual mammogram.  #2 we discussed health issues including exercise healthy diet reducing alcohol consumption.  #3 she'll be seen back in one years time   All questions were answered. The patient knows to call the clinic with any problems, questions or concerns. We can certainly see the patient much sooner if necessary.  I spent 25 minutes counseling the patient face to face. The total time spent in the appointment was 30 minutes.    Drue Second, MD Medical/Oncology [facility] [phone] (beeper) [phone] (Office)  07/09/2012, 4:53 PM

## 2012-07-30 ENCOUNTER — Ambulatory Visit (INDEPENDENT_AMBULATORY_CARE_PROVIDER_SITE_OTHER): Payer: Federal, State, Local not specified - PPO | Admitting: Family Medicine

## 2012-07-30 ENCOUNTER — Encounter: Payer: Self-pay | Admitting: Family Medicine

## 2012-07-30 VITALS — BP 130/86 | HR 105 | Temp 99.1°F | Wt 159.0 lb

## 2012-07-30 DIAGNOSIS — G4700 Insomnia, unspecified: Secondary | ICD-10-CM

## 2012-07-30 DIAGNOSIS — G44229 Chronic tension-type headache, not intractable: Secondary | ICD-10-CM

## 2012-07-30 MED ORDER — DIAZEPAM 5 MG PO TABS: 5.0000 mg | ORAL_TABLET | Freq: Every evening | ORAL | Status: DC | PRN

## 2012-07-30 MED ORDER — DICLOFENAC SODIUM 75 MG PO TBEC: 75.0000 mg | DELAYED_RELEASE_TABLET | Freq: Two times a day (BID) | ORAL | Status: DC

## 2012-07-30 NOTE — Progress Notes (Signed)
  Subjective:    Patient ID: Kristen Burke, female    DOB: [birth year], 57 y.o.   MRN: [id number]  HPI Here for 2 weeks of daily HAs. These are centered in the temples and across the forehead. They are never severe but will not go away. Advil helped a little. No sinus symptoms. No vision trouble. No nausea. She has been dealing with a lot of stress the past month and has not slept well.    Review of Systems  Constitutional: Negative.   Eyes: Negative.   Neurological: Positive for headaches.       Objective:   Physical Exam  Constitutional: She is oriented to person, place, and time. She appears well-developed and well-nourished. No distress.  HENT:  Head: Normocephalic and atraumatic.  Right Ear: External ear normal.  Left Ear: External ear normal.  Nose: Nose normal.  Mouth/Throat: Oropharynx is clear and moist.  Eyes: Conjunctivae are normal. Pupils are equal, round, and reactive to light.  Neurological: She is alert and oriented to person, place, and time. She has normal reflexes. No cranial nerve deficit. She exhibits normal muscle tone. Coordination normal.          Assessment & Plan:  Tension HAs. Try Diclofenac bid and valium at bedtime for 2 weeks. Recheck prn

## 2012-08-12 ENCOUNTER — Other Ambulatory Visit: Payer: Self-pay | Admitting: Obstetrics and Gynecology

## 2012-08-12 ENCOUNTER — Other Ambulatory Visit (HOSPITAL_COMMUNITY)
Admission: RE | Admit: 2012-08-12 | Discharge: 2012-08-12 | Disposition: A | Discharge status: Home / Self Care | Payer: Federal, State, Local not specified - PPO | Source: Ambulatory Visit | Attending: Obstetrics and Gynecology | Admitting: Obstetrics and Gynecology

## 2012-08-12 DIAGNOSIS — Z01419 Encounter for gynecological examination (general) (routine) without abnormal findings: Secondary | ICD-10-CM | POA: Insufficient documentation

## 2012-08-12 DIAGNOSIS — Z1151 Encounter for screening for human papillomavirus (HPV): Secondary | ICD-10-CM | POA: Insufficient documentation

## 2012-08-19 ENCOUNTER — Other Ambulatory Visit: Payer: Self-pay | Admitting: Oncology

## 2012-08-19 DIAGNOSIS — C50912 Malignant neoplasm of unspecified site of left female breast: Secondary | ICD-10-CM

## 2012-09-22 ENCOUNTER — Other Ambulatory Visit: Payer: Self-pay | Admitting: Family Medicine

## 2012-09-30 ENCOUNTER — Ambulatory Visit
Admission: RE | Admit: 2012-09-30 | Discharge: 2012-09-30 | Disposition: A | Discharge status: Home / Self Care | Payer: Federal, State, Local not specified - PPO | Source: Ambulatory Visit | Attending: Oncology | Admitting: Oncology

## 2012-09-30 DIAGNOSIS — Z853 Personal history of malignant neoplasm of breast: Secondary | ICD-10-CM

## 2012-09-30 DIAGNOSIS — Z9012 Acquired absence of left breast and nipple: Secondary | ICD-10-CM

## 2012-09-30 DIAGNOSIS — Z1231 Encounter for screening mammogram for malignant neoplasm of breast: Secondary | ICD-10-CM

## 2012-11-05 ENCOUNTER — Ambulatory Visit (INDEPENDENT_AMBULATORY_CARE_PROVIDER_SITE_OTHER): Payer: Federal, State, Local not specified - PPO | Admitting: Family Medicine

## 2012-11-05 ENCOUNTER — Encounter: Payer: Self-pay | Admitting: Family Medicine

## 2012-11-05 VITALS — BP 134/90 | HR 96 | Temp 98.8°F | Wt 154.0 lb

## 2012-11-05 DIAGNOSIS — H11442 Conjunctival cysts, left eye: Secondary | ICD-10-CM

## 2012-11-05 DIAGNOSIS — H11449 Conjunctival cysts, unspecified eye: Secondary | ICD-10-CM

## 2012-11-05 MED ORDER — NEOMYCIN-POLYMYXIN-HC 3.5-10000-1 OP SUSP: 2.0000 [drp] | Freq: Four times a day (QID) | OPHTHALMIC | Status: DC

## 2012-11-05 NOTE — Progress Notes (Signed)
  Subjective:    Patient ID: Kristen Burke, female    DOB: Mar 15, 1955, 57 y.o.   MRN: [id number]  HPI Here for 4 days of irritation in the left eye and a small lesion in this area. No DC. She wears contact lenses.    Review of Systems  Constitutional: Negative.   HENT: Negative.   Eyes: Positive for pain and redness. Negative for photophobia, discharge, itching and visual disturbance.  Respiratory: Negative.        Objective:   Physical Exam  Constitutional: She appears well-developed and well-nourished. No distress.  HENT:  Right Ear: External ear normal.  Left Ear: External ear normal.  Nose: Nose normal.  Mouth/Throat: Oropharynx is clear and moist.  Eyes: Pupils are equal, round, and reactive to light. Right eye exhibits no discharge. Left eye exhibits no discharge.  The left conjunctiva has some erythema and there is a small fleshy lesion lateral to the cornea  Neck: Neck supple.  Lymphadenopathy:    She has no cervical adenopathy.          Assessment & Plan:  Recheck prn. Wear glasses instead of contacts until this is cleared up.

## 2013-06-27 ENCOUNTER — Emergency Department (HOSPITAL_BASED_OUTPATIENT_CLINIC_OR_DEPARTMENT_OTHER)

## 2013-06-27 ENCOUNTER — Emergency Department (HOSPITAL_BASED_OUTPATIENT_CLINIC_OR_DEPARTMENT_OTHER)
Admission: EM | Admit: 2013-06-27 | Discharge: 2013-06-27 | Disposition: A | Discharge status: Home / Self Care | Attending: Emergency Medicine | Admitting: Emergency Medicine

## 2013-06-27 ENCOUNTER — Encounter (HOSPITAL_BASED_OUTPATIENT_CLINIC_OR_DEPARTMENT_OTHER): Payer: Self-pay | Admitting: Emergency Medicine

## 2013-06-27 DIAGNOSIS — S8990XA Unspecified injury of unspecified lower leg, initial encounter: Secondary | ICD-10-CM | POA: Diagnosis present

## 2013-06-27 DIAGNOSIS — Z8739 Personal history of other diseases of the musculoskeletal system and connective tissue: Secondary | ICD-10-CM | POA: Diagnosis not present

## 2013-06-27 DIAGNOSIS — Y990 Civilian activity done for income or pay: Secondary | ICD-10-CM | POA: Insufficient documentation

## 2013-06-27 DIAGNOSIS — Z8719 Personal history of other diseases of the digestive system: Secondary | ICD-10-CM | POA: Insufficient documentation

## 2013-06-27 DIAGNOSIS — Y939 Activity, unspecified: Secondary | ICD-10-CM | POA: Diagnosis not present

## 2013-06-27 DIAGNOSIS — W010XXA Fall on same level from slipping, tripping and stumbling without subsequent striking against object, initial encounter: Secondary | ICD-10-CM | POA: Insufficient documentation

## 2013-06-27 DIAGNOSIS — Z79899 Other long term (current) drug therapy: Secondary | ICD-10-CM | POA: Insufficient documentation

## 2013-06-27 DIAGNOSIS — Y9289 Other specified places as the place of occurrence of the external cause: Secondary | ICD-10-CM | POA: Diagnosis not present

## 2013-06-27 DIAGNOSIS — Z853 Personal history of malignant neoplasm of breast: Secondary | ICD-10-CM | POA: Diagnosis not present

## 2013-06-27 DIAGNOSIS — Z860100 Personal history of colon polyps, unspecified: Secondary | ICD-10-CM | POA: Insufficient documentation

## 2013-06-27 DIAGNOSIS — Z87891 Personal history of nicotine dependence: Secondary | ICD-10-CM | POA: Diagnosis not present

## 2013-06-27 DIAGNOSIS — S99929A Unspecified injury of unspecified foot, initial encounter: Secondary | ICD-10-CM

## 2013-06-27 DIAGNOSIS — S99919A Unspecified injury of unspecified ankle, initial encounter: Principal | ICD-10-CM

## 2013-06-27 MED ORDER — IBUPROFEN 800 MG PO TABS: 800.0000 mg | ORAL_TABLET | Freq: Three times a day (TID) | ORAL | Status: DC

## 2013-06-27 NOTE — ED Provider Notes (Signed)
CSN: [id number]     Arrival date & time 06/27/13  1508 History   First MD Initiated Contact with Patient 06/27/13 1529     Chief Complaint  Patient presents with  . Foot Injury     (Consider location/radiation/quality/duration/timing/severity/associated sxs/prior Treatment) Patient is a 58 y.o. female presenting with foot injury. The history is provided by the patient. No language interpreter was used.  Foot Injury Location:  Foot Foot location:  R foot Associated symptoms: no fever   Associated symptoms comment:  She reports tripping over a metal strip at work and bending the right great toe backward causing pain, swelling and soreness. No other injury.   Past Medical History  Diagnosis Date  . Breast cancer   . Diverticulosis   . Tubular adenoma of colon 09/2009  . Breast cancer, left breast 06/04/2002    Left breast  . Arthritis 07/09/2012   Past Surgical History  Procedure Laterality Date  . Mastectomy, radical  2004    Modified Left  . Tram  2004   Family History  Problem Relation Age of Onset  . Heart attack Father   . Breast cancer Mother   . Colon cancer Neg Hx    History  Substance Use Topics  . Smoking status: Former Smoker    Quit date: 03/05/1978  . Smokeless tobacco: Never Used  . Alcohol Use: 1.2 oz/week    2 Cans of beer per week   OB History   Grav Para Term Preterm Abortions TAB SAB Ect Mult Living                 Review of Systems  Constitutional: Negative for fever and chills.  Musculoskeletal:       See HPI.  Skin: Negative.  Negative for wound.  Neurological: Negative.  Negative for numbness.      Allergies  Sulfamethoxazole  Home Medications   Prior to Admission medications   Medication Sig Start Date End Date Taking? Authorizing Provider  alendronate (FOSAMAX) 70 MG tablet TAKE 1 TABLET BY MOUTH EVERY 7 DAYS WITH A FULL GLASS OF WATER ON AN EMPTY STOMACH 08/19/12  Yes Deatra Robinson, MD  escitalopram (LEXAPRO) 10 MG tablet Take 10  mg by mouth daily.   Yes Historical Provider, MD  OVER THE COUNTER MEDICATION daily.   Yes Historical Provider, MD  Probiotic Product (ALIGN) 4 MG CAPS Take 1 capsule by mouth daily. 06/06/11  Yes Ladene Artist, MD  diphenoxylate-atropine (LOMOTIL) 2.5-0.025 MG per tablet Take 1 tablet by mouth 4 (four) times daily as needed for diarrhea or loose stools. Will use as needed up to four a day 11/23/11   Marletta Lor, MD  neomycin-polymyxin-hydrocortisone (CORTISPORIN) 3.5-10000-1 ophthalmic suspension Place 2 drops into the left eye every 6 (six) hours. 11/05/12   Laurey Morale, MD   BP 144/88  Pulse 89  Temp(Src) 98.1 F (36.7 C) (Oral)  Resp 20  Ht 5\' 6"  (1.676 m)  Wt 158 lb (71.668 kg)  BMI 25.51 kg/m2  SpO2 96% Physical Exam  Constitutional: She is oriented to person, place, and time. She appears well-developed and well-nourished.  Neck: Normal range of motion.  Pulmonary/Chest: Effort normal.  Musculoskeletal:  Right foot mildly swollen at forsal 1st MTP. No discoloration or gross bony deformity. Tenderness limited to swollen area.  Neurological: She is alert and oriented to person, place, and time.  Skin: Skin is warm and dry.  Psychiatric: She has a normal mood and affect.  ED Course  Procedures (including critical care time) Labs Review Labs Reviewed - No data to display  Imaging Review No results found.   EKG Interpretation None      MDM   Final diagnoses:  None    1. Right foot injury  Questionable fracture on x-ray. Patient placed in post-op shoe and given crutches. She has orthopedic follow up in the community and will see next week.     Dewaine Oats, PA-C 06/27/13 1646

## 2013-06-27 NOTE — Discharge Instructions (Signed)
Cryotherapy Cryotherapy means treatment with cold. Ice or gel packs can be used to reduce both pain and swelling. Ice is the most helpful within the first 24 to 48 hours after an injury or flareup from overusing a muscle or joint. Sprains, strains, spasms, burning pain, shooting pain, and aches can all be eased with ice. Ice can also be used when recovering from surgery. Ice is effective, has very few side effects, and is safe for most people to use. PRECAUTIONS  Ice is not a safe treatment option for people with:  Raynaud's phenomenon. This is a condition affecting small blood vessels in the extremities. Exposure to cold may cause your problems to return.  Cold hypersensitivity. There are many forms of cold hypersensitivity, including:  Cold urticaria. Red, itchy hives appear on the skin when the tissues begin to warm after being iced.  Cold erythema. This is a red, itchy rash caused by exposure to cold.  Cold hemoglobinuria. Red blood cells break down when the tissues begin to warm after being iced. The hemoglobin that carry oxygen are passed into the urine because they cannot combine with blood proteins fast enough.  Numbness or altered sensitivity in the area being iced. If you have any of the following conditions, do not use ice until you have discussed cryotherapy with your caregiver:  Heart conditions, such as arrhythmia, angina, or chronic heart disease.  High blood pressure.  Healing wounds or open skin in the area being iced.  Current infections.  Rheumatoid arthritis.  Poor circulation.  Diabetes. Ice slows the blood flow in the region it is applied. This is beneficial when trying to stop inflamed tissues from spreading irritating chemicals to surrounding tissues. However, if you expose your skin to cold temperatures for too long or without the proper protection, you can damage your skin or nerves. Watch for signs of skin damage due to cold. HOME CARE INSTRUCTIONS Follow  these tips to use ice and cold packs safely.  Place a dry or damp towel between the ice and skin. A damp towel will cool the skin more quickly, so you may need to shorten the time that the ice is used.  For a more rapid response, add gentle compression to the ice.  Ice for no more than 10 to 20 minutes at a time. The bonier the area you are icing, the less time it will take to get the benefits of ice.  Check your skin after 5 minutes to make sure there are no signs of a poor response to cold or skin damage.  Rest 20 minutes or more in between uses.  Once your skin is numb, you can end your treatment. You can test numbness by very lightly touching your skin. The touch should be so light that you do not see the skin dimple from the pressure of your fingertip. When using ice, most people will feel these normal sensations in this order: cold, burning, aching, and numbness.  Do not use ice on someone who cannot communicate their responses to pain, such as small children or people with dementia. HOW TO MAKE AN ICE PACK Ice packs are the most common way to use ice therapy. Other methods include ice massage, ice baths, and cryo-sprays. Muscle creams that cause a cold, tingly feeling do not offer the same benefits that ice offers and should not be used as a substitute unless recommended by your caregiver. To make an ice pack, do one of the following:  Place crushed ice or   a bag of frozen vegetables in a sealable plastic bag. Squeeze out the excess air. Place this bag inside another plastic bag. Slide the bag into a pillowcase or place a damp towel between your skin and the bag.  Mix 3 parts water with 1 part rubbing alcohol. Freeze the mixture in a sealable plastic bag. When you remove the mixture from the freezer, it will be slushy. Squeeze out the excess air. Place this bag inside another plastic bag. Slide the bag into a pillowcase or place a damp towel between your skin and the bag. SEEK MEDICAL  CARE IF:  You develop white spots on your skin. This may give the skin a blotchy (mottled) appearance.  Your skin turns blue or pale.  Your skin becomes waxy or hard.  Your swelling gets worse. MAKE SURE YOU:   Understand these instructions.  Will watch your condition.  Will get help right away if you are not doing well or get worse. Document Released: 10/16/2010 Document Revised: 05/14/2011 Document Reviewed: 10/16/2010 [employer] Patient Information 2014 [employer].  

## 2013-06-27 NOTE — ED Provider Notes (Signed)
Medical screening examination/treatment/procedure(s) were performed by non-physician practitioner and as supervising physician I was immediately available for consultation/collaboration.   EKG Interpretation None        Malvin Johns, MD 06/27/13 1941

## 2013-06-27 NOTE — ED Notes (Addendum)
Tripped at work and c/o right foot pain- hx of injury to same foot- on the job injury- no drug screen required per supervisor Jearld Lesch

## 2013-07-06 ENCOUNTER — Telehealth: Payer: Self-pay | Admitting: Hematology and Oncology

## 2013-07-06 NOTE — Telephone Encounter (Signed)
, °

## 2013-07-08 ENCOUNTER — Other Ambulatory Visit: Payer: Federal, State, Local not specified - PPO

## 2013-07-15 ENCOUNTER — Ambulatory Visit: Payer: Federal, State, Local not specified - PPO | Admitting: Oncology

## 2013-08-12 ENCOUNTER — Other Ambulatory Visit: Payer: Self-pay | Admitting: *Deleted

## 2013-08-12 DIAGNOSIS — C50912 Malignant neoplasm of unspecified site of left female breast: Secondary | ICD-10-CM

## 2013-08-13 ENCOUNTER — Other Ambulatory Visit: Payer: Federal, State, Local not specified - PPO

## 2013-08-13 ENCOUNTER — Ambulatory Visit: Payer: Federal, State, Local not specified - PPO

## 2013-08-13 ENCOUNTER — Other Ambulatory Visit: Payer: Self-pay | Admitting: *Deleted

## 2013-08-13 NOTE — Progress Notes (Signed)
Patient FTKA today.  POF sent to re-schedule

## 2013-08-16 ENCOUNTER — Telehealth: Payer: Self-pay | Admitting: Hematology and Oncology

## 2013-08-16 NOTE — Telephone Encounter (Signed)
lmonvm re appt for 8/13 lb/VG - KK pt. appt per 6/11 pof - pt FTKA. Schedule mailed.

## 2013-09-08 ENCOUNTER — Other Ambulatory Visit: Payer: Self-pay | Admitting: Hematology and Oncology

## 2013-09-08 ENCOUNTER — Other Ambulatory Visit: Payer: Self-pay

## 2013-09-08 DIAGNOSIS — Z853 Personal history of malignant neoplasm of breast: Secondary | ICD-10-CM

## 2013-09-08 DIAGNOSIS — Z9012 Acquired absence of left breast and nipple: Secondary | ICD-10-CM

## 2013-09-09 ENCOUNTER — Other Ambulatory Visit (HOSPITAL_COMMUNITY)
Admission: RE | Admit: 2013-09-09 | Discharge: 2013-09-09 | Disposition: A | Discharge status: Home / Self Care | Payer: Federal, State, Local not specified - PPO | Source: Ambulatory Visit | Attending: Obstetrics and Gynecology | Admitting: Obstetrics and Gynecology

## 2013-09-09 ENCOUNTER — Other Ambulatory Visit: Payer: Self-pay | Admitting: Obstetrics and Gynecology

## 2013-09-09 DIAGNOSIS — Z01419 Encounter for gynecological examination (general) (routine) without abnormal findings: Secondary | ICD-10-CM | POA: Insufficient documentation

## 2013-09-10 LAB — CYTOLOGY - PAP

## 2013-10-06 ENCOUNTER — Ambulatory Visit: Payer: Self-pay

## 2013-10-14 ENCOUNTER — Inpatient Hospital Stay: Admission: RE | Admit: 2013-10-14 | Payer: Self-pay | Source: Ambulatory Visit

## 2013-10-14 ENCOUNTER — Other Ambulatory Visit: Payer: Self-pay

## 2013-10-14 DIAGNOSIS — C50912 Malignant neoplasm of unspecified site of left female breast: Secondary | ICD-10-CM

## 2013-10-15 ENCOUNTER — Other Ambulatory Visit: Payer: Self-pay

## 2013-10-15 ENCOUNTER — Telehealth: Payer: Self-pay

## 2013-10-15 ENCOUNTER — Ambulatory Visit: Payer: Self-pay | Admitting: Hematology and Oncology

## 2013-10-15 NOTE — Telephone Encounter (Signed)
Sent ltr to pt re: FTKA appt.  Requested pt call clinic to reschedule.

## 2013-11-19 ENCOUNTER — Telehealth: Payer: Self-pay | Admitting: *Deleted

## 2013-11-19 NOTE — Telephone Encounter (Signed)
Received fax from [employer] employee program regarding potential medication nonadherence with Alendronate 70mg . Copy given to Dr. Lindi Adie, original sent to HIM to be scanned.

## 2013-12-08 ENCOUNTER — Ambulatory Visit
Admission: RE | Admit: 2013-12-08 | Discharge: 2013-12-08 | Disposition: A | Discharge status: Home / Self Care | Payer: Federal, State, Local not specified - PPO | Source: Ambulatory Visit

## 2013-12-08 ENCOUNTER — Encounter (INDEPENDENT_AMBULATORY_CARE_PROVIDER_SITE_OTHER): Payer: Self-pay

## 2013-12-08 DIAGNOSIS — Z9012 Acquired absence of left breast and nipple: Secondary | ICD-10-CM

## 2013-12-08 DIAGNOSIS — Z853 Personal history of malignant neoplasm of breast: Secondary | ICD-10-CM

## 2014-02-14 ENCOUNTER — Encounter (HOSPITAL_BASED_OUTPATIENT_CLINIC_OR_DEPARTMENT_OTHER): Payer: Self-pay

## 2014-02-14 ENCOUNTER — Emergency Department (HOSPITAL_BASED_OUTPATIENT_CLINIC_OR_DEPARTMENT_OTHER)
Admission: EM | Admit: 2014-02-14 | Discharge: 2014-02-14 | Disposition: A | Discharge status: Home / Self Care | Attending: Emergency Medicine | Admitting: Emergency Medicine

## 2014-02-14 DIAGNOSIS — Z8719 Personal history of other diseases of the digestive system: Secondary | ICD-10-CM | POA: Diagnosis not present

## 2014-02-14 DIAGNOSIS — Z853 Personal history of malignant neoplasm of breast: Secondary | ICD-10-CM | POA: Diagnosis not present

## 2014-02-14 DIAGNOSIS — Z86018 Personal history of other benign neoplasm: Secondary | ICD-10-CM | POA: Diagnosis not present

## 2014-02-14 DIAGNOSIS — S0181XA Laceration without foreign body of other part of head, initial encounter: Secondary | ICD-10-CM | POA: Insufficient documentation

## 2014-02-14 DIAGNOSIS — Y288XXA Contact with other sharp object, undetermined intent, initial encounter: Secondary | ICD-10-CM | POA: Diagnosis not present

## 2014-02-14 DIAGNOSIS — Z791 Long term (current) use of non-steroidal anti-inflammatories (NSAID): Secondary | ICD-10-CM | POA: Diagnosis not present

## 2014-02-14 DIAGNOSIS — Z23 Encounter for immunization: Secondary | ICD-10-CM | POA: Insufficient documentation

## 2014-02-14 DIAGNOSIS — Z87891 Personal history of nicotine dependence: Secondary | ICD-10-CM | POA: Insufficient documentation

## 2014-02-14 DIAGNOSIS — Y998 Other external cause status: Secondary | ICD-10-CM | POA: Insufficient documentation

## 2014-02-14 DIAGNOSIS — M1990 Unspecified osteoarthritis, unspecified site: Secondary | ICD-10-CM | POA: Diagnosis not present

## 2014-02-14 DIAGNOSIS — Y9289 Other specified places as the place of occurrence of the external cause: Secondary | ICD-10-CM | POA: Diagnosis not present

## 2014-02-14 DIAGNOSIS — Y9389 Activity, other specified: Secondary | ICD-10-CM | POA: Insufficient documentation

## 2014-02-14 DIAGNOSIS — Z79899 Other long term (current) drug therapy: Secondary | ICD-10-CM | POA: Insufficient documentation

## 2014-02-14 MED ORDER — TETANUS-DIPHTH-ACELL PERTUSSIS 5-2.5-18.5 LF-MCG/0.5 IM SUSP
0.5000 mL | Freq: Once | INTRAMUSCULAR | Status: AC
  Administered 2014-02-14: 0.5 mL via INTRAMUSCULAR
  Filled 2014-02-14: qty 0.5

## 2014-02-14 MED ORDER — LIDOCAINE-EPINEPHRINE 2 %-1:100000 IJ SOLN
20.0000 mL | Freq: Once | INTRAMUSCULAR | Status: DC
  Filled 2014-02-14: qty 1

## 2014-02-14 NOTE — ED Provider Notes (Signed)
CSN: [id number]     Arrival date & time 02/14/14  0801 History   First MD Initiated Contact with Patient 02/14/14 [phone]     Chief Complaint  Patient presents with  . Head Laceration      HPI  Patient's presents for repair of a facial laceration. She was at work and was pulling a cord with a plastic handle. It slipped from her hand and struck her above her right for head and her hairline and has a one similar laceration. Minimal bleeding.  Past Medical History  Diagnosis Date  . Breast cancer   . Diverticulosis   . Tubular adenoma of colon 09/2009  . Breast cancer, left breast 06/04/2002    Left breast  . Arthritis 07/09/2012   Past Surgical History  Procedure Laterality Date  . Mastectomy, radical  2004    Modified Left  . Tram  2004   Family History  Problem Relation Age of Onset  . Heart attack Father   . Breast cancer Mother   . Colon cancer Neg Hx    History  Substance Use Topics  . Smoking status: Former Smoker    Quit date: 03/05/1978  . Smokeless tobacco: Never Used  . Alcohol Use: 1.2 oz/week    2 Cans of beer per week   OB History    No data available     Review of Systems  Skin: Positive for wound.      Allergies  Sulfamethoxazole  Home Medications   Prior to Admission medications   Medication Sig Start Date End Date Taking? Authorizing Provider  alendronate (FOSAMAX) 70 MG tablet TAKE 1 TABLET BY MOUTH EVERY 7 DAYS WITH A FULL GLASS OF WATER ON AN EMPTY STOMACH 08/19/12   Deatra Robinson, MD  escitalopram (LEXAPRO) 10 MG tablet Take 10 mg by mouth daily.    Historical Provider, MD  ibuprofen (ADVIL,MOTRIN) 800 MG tablet Take 1 tablet (800 mg total) by mouth 3 (three) times daily. 06/27/13   Shari A Upstill, PA-C  neomycin-polymyxin-hydrocortisone (CORTISPORIN) 3.5-10000-1 ophthalmic suspension Place 2 drops into the left eye every 6 (six) hours. 11/05/12   Laurey Morale, MD  OVER THE COUNTER MEDICATION daily.    Historical Provider, MD  Probiotic  Product (ALIGN) 4 MG CAPS Take 1 capsule by mouth daily. 06/06/11   Ladene Artist, MD   BP 154/95 mmHg  Pulse 83  Temp(Src) 98.1 F (36.7 C) (Oral)  Resp 18  Ht 5\' 5"  (1.651 m)  Wt 160 lb (72.576 kg)  BMI 26.63 kg/m2  SpO2 99% Physical Exam  HENT:  Head:      ED Course  LACERATION REPAIR Date/Time: 02/14/2014 9:05 AM Performed by: Tanna Furry Authorized by: Tanna Furry Consent: Verbal consent obtained. Written consent not obtained. Risks and benefits: risks, benefits and alternatives were discussed Consent given by: patient Patient understanding: patient states understanding of the procedure being performed Body area: head/neck Location details: forehead Laceration length: 1 cm Tendon involvement: none Nerve involvement: none Vascular damage: no Anesthesia: local infiltration Local anesthetic: lidocaine 1% with epinephrine Anesthetic total: 2 ml Patient sedated: no Preparation: Patient was prepped and draped in the usual sterile fashion. Irrigation solution: saline Irrigation method: syringe Amount of cleaning: standard Debridement: none Degree of undermining: none Skin closure: 5-0 nylon Number of sutures: 3 Technique: simple Approximation: close Approximation difficulty: simple Dressing: antibiotic ointment Patient tolerance: Patient tolerated the procedure well with no immediate complications   (including critical care time) Labs Review Labs  Reviewed - No data to display  Imaging Review No results found.   EKG Interpretation None      MDM   Final diagnoses:  Forehead laceration, initial encounter    Sutured with 5 days. Basic wound care.    Tanna Furry, MD 02/14/14 [phone]

## 2014-02-14 NOTE — ED Notes (Signed)
Patient here with laceration to right side of forehead, hit with pull rope at work, no loc. Complains of headache, bleeding controlled

## 2014-02-14 NOTE — Discharge Instructions (Signed)
Suture removal in 5 days.  Facial Laceration  A facial laceration is a cut on the face. These injuries can be painful and cause bleeding. Lacerations usually heal quickly, but they need special care to reduce scarring. DIAGNOSIS  Your health care provider will take a medical history, ask for details about how the injury occurred, and examine the wound to determine how deep the cut is. TREATMENT  Some facial lacerations may not require closure. Others may not be able to be closed because of an increased risk of infection. The risk of infection and the chance for successful closure will depend on various factors, including the amount of time since the injury occurred. The wound may be cleaned to help prevent infection. If closure is appropriate, pain medicines may be given if needed. Your health care provider will use stitches (sutures), wound glue (adhesive), or skin adhesive strips to repair the laceration. These tools bring the skin edges together to allow for faster healing and a better cosmetic outcome. If needed, you may also be given a tetanus shot. HOME CARE INSTRUCTIONS  Only take over-the-counter or prescription medicines as directed by your health care provider.  Follow your health care provider's instructions for wound care. These instructions will vary depending on the technique used for closing the wound. For Sutures:  Keep the wound clean and dry.   If you were given a bandage (dressing), you should change it at least once a day. Also change the dressing if it becomes wet or dirty, or as directed by your health care provider.   Wash the wound with soap and water 2 times a day. Rinse the wound off with water to remove all soap. Pat the wound dry with a clean towel.   After cleaning, apply a thin layer of the antibiotic ointment recommended by your health care provider. This will help prevent infection and keep the dressing from sticking.   You may shower as usual after the  first 24 hours. Do not soak the wound in water until the sutures are removed.   Get your sutures removed as directed by your health care provider. With facial lacerations, sutures should usually be taken out after 4-5 days to avoid stitch marks.   Wait a few days after your sutures are removed before applying any makeup. For Skin Adhesive Strips:  Keep the wound clean and dry.   Do not get the skin adhesive strips wet. You may bathe carefully, using caution to keep the wound dry.   If the wound gets wet, pat it dry with a clean towel.   Skin adhesive strips will fall off on their own. You may trim the strips as the wound heals. Do not remove skin adhesive strips that are still stuck to the wound. They will fall off in time.  For Wound Adhesive:  You may briefly wet your wound in the shower or bath. Do not soak or scrub the wound. Do not swim. Avoid periods of heavy sweating until the skin adhesive has fallen off on its own. After showering or bathing, gently pat the wound dry with a clean towel.   Do not apply liquid medicine, cream medicine, ointment medicine, or makeup to your wound while the skin adhesive is in place. This may loosen the film before your wound is healed.   If a dressing is placed over the wound, be careful not to apply tape directly over the skin adhesive. This may cause the adhesive to be pulled off before the  wound is healed.   Avoid prolonged exposure to sunlight or tanning lamps while the skin adhesive is in place.  The skin adhesive will usually remain in place for 5-10 days, then naturally fall off the skin. Do not pick at the adhesive film.  After Healing: Once the wound has healed, cover the wound with sunscreen during the day for 1 full year. This can help minimize scarring. Exposure to ultraviolet light in the first year will darken the scar. It can take 1-2 years for the scar to lose its redness and to heal completely.  SEEK IMMEDIATE MEDICAL CARE  IF:  You have redness, pain, or swelling around the wound.   You see ayellowish-white fluid (pus) coming from the wound.   You have chills or a fever.  MAKE SURE YOU:  Understand these instructions.  Will watch your condition.  Will get help right away if you are not doing well or get worse. Document Released: 03/29/2004 Document Revised: 12/10/2012 Document Reviewed: 10/02/2012 [facility] Patient Information 2015 [place], [place]. This information is not intended to replace advice given to you by your health care provider. Make sure you discuss any questions you have with your health care provider.

## 2014-02-19 ENCOUNTER — Ambulatory Visit (INDEPENDENT_AMBULATORY_CARE_PROVIDER_SITE_OTHER): Payer: Federal, State, Local not specified - PPO | Admitting: Family Medicine

## 2014-02-19 ENCOUNTER — Encounter: Payer: Self-pay | Admitting: Family Medicine

## 2014-02-19 VITALS — BP 124/88 | HR 98 | Temp 98.2°F | Ht 65.0 in | Wt 159.4 lb

## 2014-02-19 DIAGNOSIS — T148 Other injury of unspecified body region: Secondary | ICD-10-CM | POA: Diagnosis not present

## 2014-02-19 DIAGNOSIS — IMO0002 Reserved for concepts with insufficient information to code with codable children: Secondary | ICD-10-CM

## 2014-02-19 NOTE — Progress Notes (Signed)
Pre visit review using our clinic review tool, if applicable. No additional management support is needed unless otherwise documented below in the visit note. 

## 2014-02-19 NOTE — Progress Notes (Signed)
  HPI:  Follow up head Lac: -went to ED 12/13 for small lac R forehead from rope -3 sutures per notes -denies: pain, oozing, drainage  ROS: See pertinent positives and negatives per HPI.  Past Medical History  Diagnosis Date  . Breast cancer   . Diverticulosis   . Tubular adenoma of colon 09/2009  . Breast cancer, left breast 06/04/2002    Left breast  . Arthritis 07/09/2012    Past Surgical History  Procedure Laterality Date  . Mastectomy, radical  2004    Modified Left  . Tram  2004    Family History  Problem Relation Age of Onset  . Heart attack Father   . Breast cancer Mother   . Colon cancer Neg Hx     History   Social History  . Marital Status: Married    Spouse Name: N/A    Number of Children: 2  . 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 Smoking status: Former Smoker    Quit date: 03/05/1978  . Smokeless tobacco: Never Used  . Alcohol Use: 1.2 oz/week    2 Cans of beer per week  . Drug Use: No  . Sexual Activity: Yes   Other Topics Concern  . None   Social History Narrative   Daily caffeine    Current outpatient prescriptions: alendronate (FOSAMAX) 70 MG tablet, TAKE 1 TABLET BY MOUTH EVERY 7 DAYS WITH A FULL GLASS OF WATER ON AN EMPTY STOMACH, Disp: 12 tablet, Rfl: 4;  escitalopram (LEXAPRO) 10 MG tablet, Take 10 mg by mouth daily., Disp: , Rfl: ;  OVER THE COUNTER MEDICATION, daily., Disp: , Rfl: ;  Probiotic Product (ALIGN) 4 MG CAPS, Take 1 capsule by mouth daily., Disp: 14 capsule, Rfl: 0 Current facility-administered medications: methylPREDNISolone acetate (DEPO-MEDROL) injection 80 mg, 80 mg, Intramuscular, Once, Timoteo Gaul, FNP  EXAM:  Filed Vitals:   02/19/14 0843  BP: 124/88  Pulse: 98  Temp: 98.2 F (36.8 C)    Body mass index is 26.53 kg/(m^2).  GENERAL: vitals reviewed and listed above, alert, oriented, appears well hydrated and in no acute  distress  HEENT: atraumatic, conjunttiva clear, no obvious abnormalities on inspection of external nose and ears  NECK: no obvious masses on inspection  SKIN: healed 1 cm in length lac R temple with 3 sutures  MS: moves all extremities without noticeable abnormality  PSYCH: pleasant and cooperative, no obvious depression or anxiety  ASSESSMENT AND PLAN:  Discussed the following assessment and plan:  Laceration  -sutures removed, looks great, wound care instructions   There are no Patient Instructions on file for this visit.   Colin Benton R.

## 2014-04-16 ENCOUNTER — Ambulatory Visit (INDEPENDENT_AMBULATORY_CARE_PROVIDER_SITE_OTHER): Payer: Federal, State, Local not specified - PPO | Admitting: Internal Medicine

## 2014-04-16 ENCOUNTER — Encounter: Payer: Self-pay | Admitting: Internal Medicine

## 2014-04-16 VITALS — BP 140/90 | HR 114 | Temp 98.0°F | Resp 20 | Ht 65.0 in | Wt 166.0 lb

## 2014-04-16 DIAGNOSIS — R0789 Other chest pain: Secondary | ICD-10-CM

## 2014-04-16 NOTE — Progress Notes (Signed)
Pre visit review using our clinic review tool, if applicable. No additional management support is needed unless otherwise documented below in the visit note. 

## 2014-04-16 NOTE — Progress Notes (Signed)
Subjective:    Patient ID: Kristen Burke, female    DOB: 03/14/1955, 59 y.o.   MRN: [id number]  HPI  59 year old patient who presents with a three-day history of right lower chest wall pain.  She was cleaning in a awkward position with the right arm outstretched and she felt a pop in acute pain in the right anterolateral lower chest wall area.  Pain continues to be bothersome and worsened by activity and movement.  She does work in the [employer] and is required to do a lot of heavy lifting and other activities that would aggravate the pain.  She has been unable to work for the past 2 days  Past Medical History  Diagnosis Date  . Breast cancer   . Diverticulosis   . Tubular adenoma of colon 09/2009  . Breast cancer, left breast 06/04/2002    Left breast  . Arthritis 07/09/2012    History   Social History  . Marital Status: Married    Spouse Name: N/A  . Number of Children: 2  . 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 Smoking status: Former Smoker    Quit date: 03/05/1978  . Smokeless tobacco: Never Used  . Alcohol Use: 1.2 oz/week    2 Cans of beer per week  . Drug Use: No  . Sexual Activity: Yes   Other Topics Concern  . Not on file   Social History Narrative   Daily caffeine    Past Surgical History  Procedure Laterality Date  . Mastectomy, radical  2004    Modified Left  . Tram  2004    Family History  Problem Relation Age of Onset  . Heart attack Father   . Breast cancer Mother   . Colon cancer Neg Hx     Allergies  Allergen Reactions  . Sulfamethoxazole     REACTION: unspecified    Current Outpatient Prescriptions on File Prior to Visit  Medication Sig Dispense Refill  . alendronate (FOSAMAX) 70 MG tablet TAKE 1 TABLET BY MOUTH EVERY 7 DAYS WITH A FULL GLASS OF WATER ON AN EMPTY STOMACH 12 tablet 4  . escitalopram (LEXAPRO) 10 MG tablet Take 10 mg by mouth daily.    Marland Kitchen OVER THE  COUNTER MEDICATION daily.    . Probiotic Product (ALIGN) 4 MG CAPS Take 1 capsule by mouth daily. 14 capsule 0   No current facility-administered medications on file prior to visit.    BP 140/90 mmHg  Pulse 114  Temp(Src) 98 F (36.7 C) (Oral)  Resp 20  Ht 5\' 5"  (1.651 m)  Wt 166 lb (75.297 kg)  BMI 27.62 kg/m2  SpO2 98%     Review of Systems  Constitutional: Negative.   HENT: Negative for congestion, dental problem, hearing loss, rhinorrhea, sinus pressure, sore throat and tinnitus.   Eyes: Negative for pain, discharge and visual disturbance.  Respiratory: Negative for cough and shortness of breath.   Cardiovascular: Positive for chest pain. Negative for palpitations and leg swelling.  Gastrointestinal: Negative for nausea, vomiting, abdominal pain, diarrhea, constipation, blood in stool and abdominal distention.  Genitourinary: Negative for dysuria, urgency, frequency, hematuria, flank pain, vaginal bleeding, vaginal discharge, difficulty urinating, vaginal pain and pelvic pain.  Musculoskeletal: Negative for joint swelling, arthralgias and gait problem.  Skin: Negative for rash.  Neurological: Negative for dizziness, syncope, speech difficulty, weakness, numbness and headaches.  Hematological: Negative for  adenopathy.  Psychiatric/Behavioral: Negative for behavioral problems, dysphoric mood and agitation. The patient is not nervous/anxious.        Objective:   Physical Exam  Constitutional: She appears well-developed.  Repeat blood pressure still 140 over 90  Neck: Normal range of motion. Neck supple.  Cardiovascular: Normal rate, regular rhythm and normal heart sounds.   Pulse 100  Pulmonary/Chest: Effort normal and breath sounds normal. She exhibits tenderness.  Tenderness over the right lower costochondral junction          Assessment & Plan:   Chest wall pain.  Will treat with Advil and rest.  Will give a note to be out of work for the next few days

## 2014-04-16 NOTE — Patient Instructions (Signed)
Take Aleve 200 mg twice daily for pain or swelling  Call or return to clinic prn if these symptoms worsen or fail to improve as anticipated.

## 2014-04-21 ENCOUNTER — Ambulatory Visit (INDEPENDENT_AMBULATORY_CARE_PROVIDER_SITE_OTHER): Payer: Federal, State, Local not specified - PPO | Admitting: Internal Medicine

## 2014-04-21 ENCOUNTER — Encounter: Payer: Self-pay | Admitting: Internal Medicine

## 2014-04-21 ENCOUNTER — Ambulatory Visit (INDEPENDENT_AMBULATORY_CARE_PROVIDER_SITE_OTHER)
Admission: RE | Admit: 2014-04-21 | Discharge: 2014-04-21 | Disposition: A | Discharge status: Home / Self Care | Payer: Federal, State, Local not specified - PPO | Source: Ambulatory Visit | Attending: Internal Medicine | Admitting: Internal Medicine

## 2014-04-21 VITALS — BP 140/90 | HR 105 | Temp 98.5°F | Resp 20 | Ht 65.0 in | Wt 167.0 lb

## 2014-04-21 DIAGNOSIS — R0789 Other chest pain: Secondary | ICD-10-CM

## 2014-04-21 DIAGNOSIS — C50912 Malignant neoplasm of unspecified site of left female breast: Secondary | ICD-10-CM

## 2014-04-21 NOTE — Progress Notes (Signed)
Subjective:    Patient ID: Kristen Burke, female    DOB: 07/17/1955, 59 y.o.   MRN: [id number]  HPI 59 year old patient who has a prior history of left breast cancer.  One week ago, she moved in a awkward position and felt immediate pain and a popping sensation in her right  anterolateral chest wall area.  She was seen 5 days ago and felt to have local tenderness over the lower costochondral junction.  She has been using Advil and minimizing her activities but continues to have persistent pain is best with bending and twisting.  No shortness of breath  Past Medical History  Diagnosis Date  . Breast cancer   . Diverticulosis   . Tubular adenoma of colon 09/2009  . Breast cancer, left breast 06/04/2002    Left breast  . Arthritis 07/09/2012    History   Social History  . Marital Status: Married    Spouse Name: N/A  . Number of Children: 2  . 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 Smoking status: Former Smoker    Quit date: 03/05/1978  . Smokeless tobacco: Never Used  . Alcohol Use: 1.2 oz/week    2 Cans of beer per week  . Drug Use: No  . Sexual Activity: Yes   Other Topics Concern  . Not on file   Social History Narrative   Daily caffeine    Past Surgical History  Procedure Laterality Date  . Mastectomy, radical  2004    Modified Left  . Tram  2004    Family History  Problem Relation Age of Onset  . Heart attack Father   . Breast cancer Mother   . Colon cancer Neg Hx     Allergies  Allergen Reactions  . Sulfamethoxazole     REACTION: unspecified    Current Outpatient Prescriptions on File Prior to Visit  Medication Sig Dispense Refill  . alendronate (FOSAMAX) 70 MG tablet TAKE 1 TABLET BY MOUTH EVERY 7 DAYS WITH A FULL GLASS OF WATER ON AN EMPTY STOMACH 12 tablet 4  . escitalopram (LEXAPRO) 10 MG tablet Take 10 mg by mouth daily.    Marland Kitchen OVER THE COUNTER MEDICATION daily.    . Probiotic  Product (ALIGN) 4 MG CAPS Take 1 capsule by mouth daily. 14 capsule 0   No current facility-administered medications on file prior to visit.    BP 140/90 mmHg  Pulse 105  Temp(Src) 98.5 F (36.9 C) (Oral)  Resp 20  Ht 5\' 5"  (1.651 m)  Wt 167 lb (75.751 kg)  BMI 27.79 kg/m2  SpO2 96%       Review of Systems  Constitutional: Negative.   HENT: Negative for congestion, dental problem, hearing loss, rhinorrhea, sinus pressure, sore throat and tinnitus.   Eyes: Negative for pain, discharge and visual disturbance.  Respiratory: Negative for cough and shortness of breath.   Cardiovascular: Positive for chest pain. Negative for palpitations and leg swelling.  Gastrointestinal: Negative for nausea, vomiting, abdominal pain, diarrhea, constipation, blood in stool and abdominal distention.  Genitourinary: Negative for dysuria, urgency, frequency, hematuria, flank pain, vaginal bleeding, vaginal discharge, difficulty urinating, vaginal pain and pelvic pain.  Musculoskeletal: Negative for joint swelling, arthralgias and gait problem.  Skin: Negative for rash.  Neurological: Negative for dizziness, syncope, speech difficulty, weakness, numbness and headaches.  Hematological: Negative for adenopathy.  Psychiatric/Behavioral: Negative for behavioral problems, dysphoric mood and  agitation. The patient is not nervous/anxious.        Objective:   Physical Exam  Constitutional: She appears well-developed and well-nourished. No distress.  Neck: Normal range of motion. Neck supple.  Cardiovascular: Normal rate, regular rhythm and normal heart sounds.   Pulmonary/Chest: Effort normal and breath sounds normal. No respiratory distress. She has no wheezes. She has no rales. She exhibits tenderness.  Persistent tenderness over the lower right costochondral joints  Lymphadenopathy:    She has no cervical adenopathy.          Assessment & Plan:   Persistent chest wall pain.  Will check rib  series to rule out other pathology.  We'll continue symptomatic care and minimize her activities and aggravating factors

## 2014-04-21 NOTE — Progress Notes (Signed)
Pre visit review using our clinic review tool, if applicable. No additional management support is needed unless otherwise documented below in the visit note. 

## 2014-04-21 NOTE — Patient Instructions (Signed)
X-ray evaluation as discussed  You  may move around, but avoid painful motions and activities.

## 2014-07-22 ENCOUNTER — Encounter: Payer: Self-pay | Admitting: Gastroenterology

## 2014-08-03 ENCOUNTER — Encounter: Payer: Self-pay | Admitting: Internal Medicine

## 2014-08-03 ENCOUNTER — Ambulatory Visit (INDEPENDENT_AMBULATORY_CARE_PROVIDER_SITE_OTHER): Payer: Federal, State, Local not specified - PPO | Admitting: Internal Medicine

## 2014-08-03 VITALS — BP 130/90 | HR 106 | Temp 98.4°F | Resp 20 | Ht 65.0 in | Wt 164.0 lb

## 2014-08-03 DIAGNOSIS — B9789 Other viral agents as the cause of diseases classified elsewhere: Principal | ICD-10-CM

## 2014-08-03 DIAGNOSIS — J069 Acute upper respiratory infection, unspecified: Secondary | ICD-10-CM

## 2014-08-03 DIAGNOSIS — G4700 Insomnia, unspecified: Secondary | ICD-10-CM

## 2014-08-03 MED ORDER — TRAZODONE HCL 50 MG PO TABS: 25.0000 mg | ORAL_TABLET | Freq: Every evening | ORAL | Status: AC | PRN

## 2014-08-03 NOTE — Progress Notes (Signed)
Subjective:    Patient ID: Kristen Burke, female    DOB: 1955/11/26, 59 y.o.   MRN: [id number]  HPI  59 year old patient who presents with a 2 day history of sinus congestion, sore throat, cough, and some associated headaches.  Briefly, she had some left ear discomfort that has resolved.  She states that she had a similar illness 2 weeks ago that was brief and self-limiting.  She also complains of some insomnia issues.  She has been using melatonin and gets to sleep without much difficulty, but reawakens. No fever She was able to work a full workday yesterday and is scheduled to be off today and tomorrow  Past Medical History  Diagnosis Date  . Breast cancer   . Diverticulosis   . Tubular adenoma of colon 09/2009  . Breast cancer, left breast 06/04/2002    Left breast  . Arthritis 07/09/2012    History   Social History  . Marital Status: Married    Spouse Name: N/A  . Number of Children: 2  . 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 Smoking status: Former Smoker    Quit date: 03/05/1978  . Smokeless tobacco: Never Used  . Alcohol Use: 1.2 oz/week    2 Cans of beer per week  . Drug Use: No  . Sexual Activity: Yes   Other Topics Concern  . Not on file   Social History Narrative   Daily caffeine    Past Surgical History  Procedure Laterality Date  . Mastectomy, radical  2004    Modified Left  . Tram  2004    Family History  Problem Relation Age of Onset  . Heart attack Father   . Breast cancer Mother   . Colon cancer Neg Hx     Allergies  Allergen Reactions  . Sulfamethoxazole     REACTION: unspecified    Current Outpatient Prescriptions on File Prior to Visit  Medication Sig Dispense Refill  . alendronate (FOSAMAX) 70 MG tablet TAKE 1 TABLET BY MOUTH EVERY 7 DAYS WITH A FULL GLASS OF WATER ON AN EMPTY STOMACH 12 tablet 4  . escitalopram (LEXAPRO) 10 MG tablet Take 10 mg by mouth daily.     Marland Kitchen ibuprofen (ADVIL,MOTRIN) 200 MG tablet Take 400 mg by mouth every 4 (four) hours as needed.    . Probiotic Product (ALIGN) 4 MG CAPS Take 1 capsule by mouth daily. 14 capsule 0   No current facility-administered medications on file prior to visit.    BP 130/90 mmHg  Pulse 106  Temp(Src) 98.4 F (36.9 C) (Oral)  Resp 20  Ht 5\' 5"  (1.651 m)  Wt 164 lb (74.39 kg)  BMI 27.29 kg/m2  SpO2 97%     Review of Systems  Constitutional: Positive for activity change and fatigue.  HENT: Positive for congestion, sinus pressure and sore throat. Negative for dental problem, hearing loss, rhinorrhea and tinnitus.   Eyes: Negative for pain, discharge and visual disturbance.  Respiratory: Positive for cough. Negative for shortness of breath.   Cardiovascular: Negative for chest pain, palpitations and leg swelling.  Gastrointestinal: Negative for nausea, vomiting, abdominal pain, diarrhea, constipation, blood in stool and abdominal distention.  Genitourinary: Negative for dysuria, urgency, frequency, hematuria, flank pain, vaginal bleeding, vaginal discharge, difficulty urinating, vaginal pain and pelvic pain.  Musculoskeletal: Negative for joint swelling, arthralgias and gait problem.  Skin: Negative for rash.  Neurological:  Negative for dizziness, syncope, speech difficulty, weakness, numbness and headaches.  Hematological: Negative for adenopathy.  Psychiatric/Behavioral: Negative for behavioral problems, dysphoric mood and agitation. The patient is not nervous/anxious.        Objective:   Physical Exam  Constitutional: She is oriented to person, place, and time. She appears well-developed and well-nourished.  HENT:  Head: Normocephalic.  Right Ear: External ear normal.  Left Ear: External ear normal.  Mouth/Throat: Oropharynx is clear and moist.  Both tympanic membranes normal  Eyes: Conjunctivae and EOM are normal. Pupils are equal, round, and reactive to light.  Neck: Normal range  of motion. Neck supple. No thyromegaly present.  Cardiovascular: Normal rate, regular rhythm, normal heart sounds and intact distal pulses.   Pulmonary/Chest: Effort normal and breath sounds normal.  Abdominal: Soft. Bowel sounds are normal. She exhibits no mass. There is no tenderness.  Musculoskeletal: Normal range of motion.  Lymphadenopathy:    She has no cervical adenopathy.  Neurological: She is alert and oriented to person, place, and time.  Skin: Skin is warm and dry. No rash noted.  Psychiatric: She has a normal mood and affect. Her behavior is normal.          Assessment & Plan:  Viral URI.  Will treat symptomatically Insomnia.  Will give a trial of trazodone

## 2014-08-03 NOTE — Progress Notes (Signed)
Pre visit review using our clinic review tool, if applicable. No additional management support is needed unless otherwise documented below in the visit note. 

## 2014-08-03 NOTE — Patient Instructions (Signed)
HOME CARE INSTRUCTIONS  Drink plenty of water. Water helps thin the mucus so your sinuses can drain more easily.  Use a humidifier.  Inhale steam 3 to 4 times a day (for example, sit in the bathroom with the shower running).  Apply a warm, moist washcloth to your face 3 to 4 times a day, or as directed by your health care provider.  Use saline nasal sprays to help moisten and clean your sinuses.   Insomnia Insomnia is frequent trouble falling and/or staying asleep. Insomnia can be a long term problem or a short term problem. Both are common. Insomnia can be a short term problem when the wakefulness is related to a certain stress or worry. Long term insomnia is often related to ongoing stress during waking hours and/or poor sleeping habits. Overtime, sleep deprivation itself can make the problem worse. Every little thing feels more severe because you are overtired and your ability to cope is decreased. CAUSES   Stress, anxiety, and depression.  Poor sleeping habits.  Distractions such as TV in the bedroom.  Naps close to bedtime.  Engaging in emotionally charged conversations before bed.  Technical reading before sleep.  Alcohol and other sedatives. They may make the problem worse. They can hurt normal sleep patterns and normal dream activity.  Stimulants such as caffeine for several hours prior to bedtime.  Pain syndromes and shortness of breath can cause insomnia.  Exercise late at night.  Changing time zones may cause sleeping problems (jet lag). It is sometimes helpful to have someone observe your sleeping patterns. They should look for periods of not breathing during the night (sleep apnea). They should also look to see how long those periods last. If you live alone or observers are uncertain, you can also be observed at a sleep clinic where your sleep patterns will be professionally monitored. Sleep apnea requires a checkup and treatment. Give your caregivers your medical  history. Give your caregivers observations your family has made about your sleep.  SYMPTOMS   Not feeling rested in the morning.  Anxiety and restlessness at bedtime.  Difficulty falling and staying asleep. TREATMENT   Your caregiver may prescribe treatment for an underlying medical disorders. Your caregiver can give advice or help if you are using alcohol or other drugs for self-medication. Treatment of underlying problems will usually eliminate insomnia problems.  Medications can be prescribed for short time use. They are generally not recommended for lengthy use.  Over-the-counter sleep medicines are not recommended for lengthy use. They can be habit forming.  You can promote easier sleeping by making lifestyle changes such as:  Using relaxation techniques that help with breathing and reduce muscle tension.  Exercising earlier in the day.  Changing your diet and the time of your last meal. No night time snacks.  Establish a regular time to go to bed.  Counseling can help with stressful problems and worry.  Soothing music and white noise may be helpful if there are background noises you cannot remove.  Stop tedious detailed work at least one hour before bedtime. HOME CARE INSTRUCTIONS   Keep a diary. Inform your caregiver about your progress. This includes any medication side effects. See your caregiver regularly. Take note of:  Times when you are asleep.  Times when you are awake during the night.  The quality of your sleep.  How you feel the next day. This information will help your caregiver care for you.  Get out of bed if you are still  awake after 15 minutes. Read or do some quiet activity. Keep the lights down. Wait until you feel sleepy and go back to bed.  Keep regular sleeping and waking hours. Avoid naps.  Exercise regularly.  Avoid distractions at bedtime. Distractions include watching television or engaging in any intense or detailed activity like  attempting to balance the household checkbook.  Develop a bedtime ritual. Keep a familiar routine of bathing, brushing your teeth, climbing into bed at the same time each night, listening to soothing music. Routines increase the success of falling to sleep faster.  Use relaxation techniques. This can be using breathing and muscle tension release routines. It can also include visualizing peaceful scenes. You can also help control troubling or intruding thoughts by keeping your mind occupied with boring or repetitive thoughts like the old concept of counting sheep. You can make it more creative like imagining planting one beautiful flower after another in your backyard garden.  During your day, work to eliminate stress. When this is not possible use some of the previous suggestions to help reduce the anxiety that accompanies stressful situations. MAKE SURE YOU:   Understand these instructions.  Will watch your condition.  Will get help right away if you are not doing well or get worse. Document Released: 02/17/2000 Document Revised: 05/14/2011 Document Reviewed: 03/19/2007 [facility] Patient Information 2015 [place], [place]. This information is not intended to replace advice given to you by your health care provider. Make sure you discuss any questions you have with your health care provider.

## 2014-08-10 LAB — CBC AND DIFFERENTIAL
HEMATOCRIT: 46 % (ref 36–46)
HEMOGLOBIN: 15.8 g/dL (ref 12.0–16.0)
Platelets: 337 10*3/uL (ref 150–399)
WBC: 5.9 10*3/mL

## 2014-08-10 LAB — BASIC METABOLIC PANEL
BUN: 9 mg/dL (ref 4–21)
Creatinine: 0.6 mg/dL (ref 0.5–1.1)
Glucose: 90 mg/dL
Potassium: 4 mmol/L (ref 3.4–5.3)
SODIUM: 142 mmol/L (ref 137–147)

## 2014-08-10 LAB — HEPATIC FUNCTION PANEL
ALK PHOS: 115 U/L (ref 25–125)
ALT: 23 U/L (ref 7–35)
AST: 23 U/L (ref 13–35)
BILIRUBIN DIRECT: 0.12 mg/dL (ref 0.01–0.4)
BILIRUBIN, TOTAL: 0.5 mg/dL

## 2014-08-10 LAB — TSH: TSH: 0.64 u[IU]/mL (ref 0.41–5.90)

## 2014-08-16 ENCOUNTER — Encounter: Payer: Self-pay | Admitting: Internal Medicine

## 2014-11-23 ENCOUNTER — Other Ambulatory Visit (HOSPITAL_COMMUNITY)
Admission: RE | Admit: 2014-11-23 | Discharge: 2014-11-23 | Disposition: A | Discharge status: Home / Self Care | Payer: Federal, State, Local not specified - PPO | Source: Ambulatory Visit | Attending: Obstetrics and Gynecology | Admitting: Obstetrics and Gynecology

## 2014-11-23 ENCOUNTER — Other Ambulatory Visit: Payer: Self-pay | Admitting: Obstetrics and Gynecology

## 2014-11-23 DIAGNOSIS — Z01419 Encounter for gynecological examination (general) (routine) without abnormal findings: Secondary | ICD-10-CM | POA: Diagnosis present

## 2014-11-24 LAB — CYTOLOGY - PAP

## 2015-05-24 ENCOUNTER — Encounter: Payer: Self-pay | Admitting: Adult Health

## 2015-05-24 ENCOUNTER — Ambulatory Visit (INDEPENDENT_AMBULATORY_CARE_PROVIDER_SITE_OTHER): Payer: Federal, State, Local not specified - PPO | Admitting: Adult Health

## 2015-05-24 VITALS — BP 152/74 | Temp 98.5°F | Ht 65.0 in | Wt 163.0 lb

## 2015-05-24 DIAGNOSIS — G43109 Migraine with aura, not intractable, without status migrainosus: Secondary | ICD-10-CM

## 2015-05-24 MED ORDER — ONDANSETRON HCL 4 MG PO TABS: 4.0000 mg | ORAL_TABLET | Freq: Three times a day (TID) | ORAL | Status: AC | PRN

## 2015-05-24 MED ORDER — RIZATRIPTAN BENZOATE 10 MG PO TABS: 10.0000 mg | ORAL_TABLET | ORAL | Status: AC | PRN

## 2015-05-24 NOTE — Progress Notes (Signed)
Subjective:    Patient ID: Kristen Burke, female    DOB: 1955/12/14, 60 y.o.   MRN: [id number]  Migraine  This is a recurrent problem. The current episode started more than 1 month ago (January). The problem occurs monthly (1-2). The problem has been waxing and waning. The pain is located in the right unilateral, frontal and parietal region. The pain does not radiate. The pain quality is similar to prior headaches. The quality of the pain is described as throbbing. The pain is at a severity of 7/10. The pain is moderate. Associated symptoms include anorexia, nausea and phonophobia. Pertinent negatives include no eye pain, eye redness, eye watering, neck pain, numbness, photophobia, rhinorrhea or seizures. Associated symptoms comments: Aura. The symptoms are aggravated by unknown. She has tried acetaminophen, darkened room, Excedrin and NSAIDs for the symptoms. The treatment provided mild relief. (Headaches)   She feels as though the headaches that she is currently experiencing are worse than the headaches that she's experienced throughout her life. Her last migraine was 2 weeks ago she does not feel as though they're coming more often.  she does report that her migraines do last longer than 24 hours on average and is starting to affect her work as she has to call out relief early   Review of Systems  Constitutional: Positive for activity change and appetite change.  HENT: Negative for rhinorrhea.   Eyes: Positive for visual disturbance. Negative for photophobia, pain, discharge, redness and itching.  Respiratory: Negative.   Cardiovascular: Negative.   Gastrointestinal: Positive for nausea and anorexia.  Musculoskeletal: Negative for neck pain.  Skin: Negative.   Neurological: Positive for headaches. Negative for tremors, seizures, syncope, speech difficulty and numbness.  Psychiatric/Behavioral: Positive for decreased concentration.   Past Medical History  Diagnosis Date  . Breast cancer  ([place])   . Diverticulosis   . Tubular adenoma of colon 09/2009  . Breast cancer, left breast ([place]) 06/04/2002    Left breast  . Arthritis 07/09/2012    Social History   Social History  . Marital Status: Married    Spouse Name: N/A  . Number of Children: 2  . 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 Smoking status: Former Smoker    Quit date: 03/05/1978  . Smokeless tobacco: Never Used  . Alcohol Use: 1.2 oz/week    2 Cans of beer per week  . Drug Use: No  . Sexual Activity: Yes   Other Topics Concern  . Not on file   Social History Narrative   Daily caffeine    Past Surgical History  Procedure Laterality Date  . Mastectomy, radical  2004    Modified Left  . Tram  2004    Family History  Problem Relation Age of Onset  . Heart attack Father   . Breast cancer Mother   . Colon cancer Neg Hx     Allergies  Allergen Reactions  . Sulfamethoxazole     REACTION: unspecified    Current Outpatient Prescriptions on File Prior to Visit  Medication Sig Dispense Refill  . alendronate (FOSAMAX) 70 MG tablet TAKE 1 TABLET BY MOUTH EVERY 7 DAYS WITH A FULL GLASS OF WATER ON AN EMPTY STOMACH 12 tablet 4  . escitalopram (LEXAPRO) 10 MG tablet Take 10 mg by mouth daily.    Marland Kitchen ibuprofen (ADVIL,MOTRIN) 200 MG tablet Take 400 mg by mouth every 4 (four) hours  as needed.    . Probiotic Product (ALIGN) 4 MG CAPS Take 1 capsule by mouth daily. 14 capsule 0  . traZODone (DESYREL) 50 MG tablet Take 0.5-1 tablets (25-50 mg total) by mouth at bedtime as needed for sleep. (Patient not taking: Reported on 05/24/2015) 30 tablet 3   No current facility-administered medications on file prior to visit.    BP 152/74 mmHg  Temp(Src) 98.5 F (36.9 C) (Oral)  Ht 5\' 5"  (1.651 m)  Wt 163 lb (73.936 kg)  BMI 27.12 kg/m2       Objective:   Physical Exam  Constitutional: She is oriented to person, place, and time. Vital signs  are normal. She appears well-developed and well-nourished. No distress.  HENT:  Head: Normocephalic and atraumatic.  Right Ear: External ear normal.  Left Ear: External ear normal.  Nose: Nose normal.  Mouth/Throat: Oropharynx is clear and moist.  Eyes: Conjunctivae and EOM are normal. Pupils are equal, round, and reactive to light. Right eye exhibits no discharge. Left eye exhibits no discharge. No scleral icterus.  Cardiovascular: Normal rate, regular rhythm, normal heart sounds and intact distal pulses.  Exam reveals no gallop and no friction rub.   No murmur heard. Pulmonary/Chest: Effort normal and breath sounds normal. No respiratory distress. She has no wheezes. She has no rales. She exhibits no tenderness.  Musculoskeletal: Normal range of motion. She exhibits no edema or tenderness.  Neurological: She is alert and oriented to person, place, and time.  Skin: Skin is warm and dry. No rash noted. She is not diaphoretic. No erythema. No pallor.  Psychiatric: She has a normal mood and affect. Her behavior is normal. Judgment and thought content normal.  Nursing note and vitals reviewed.     Assessment & Plan:  1. Migraine with aura and without status migrainosus, not intractable - rizatriptan (MAXALT) 10 MG tablet; Take 1 tablet (10 mg total) by mouth as needed for migraine. May repeat in 2 hours if needed  Dispense: 10 tablet; Refill: 2 - ondansetron (ZOFRAN) 4 MG tablet; Take 1 tablet (4 mg total) by mouth every 8 (eight) hours as needed for nausea or vomiting.  Dispense: 20 tablet; Refill: 0

## 2015-05-24 NOTE — Patient Instructions (Addendum)
Your exam is consistent with migraine headaches.   I have sent in two prescriptions  Maxalt ( migraine medication) and Zofran ( nausea medication).   If you continue to have headaches, please follow up with Dr.K.   Migraine Headache A migraine headache is an intense, throbbing pain on one or both sides of your head. A migraine can last for 30 minutes to several hours. CAUSES  The exact cause of a migraine headache is not always known. However, a migraine may be caused when nerves in the brain become irritated and release chemicals that cause inflammation. This causes pain. Certain things may also trigger migraines, such as:  Alcohol.  Smoking.  Stress.  Menstruation.  Aged cheeses.  Foods or drinks that contain nitrates, glutamate, aspartame, or tyramine.  Lack of sleep.  Chocolate.  Caffeine.  Hunger.  Physical exertion.  Fatigue.  Medicines used to treat chest pain (nitroglycerine), birth control pills, estrogen, and some blood pressure medicines. SIGNS AND SYMPTOMS  Pain on one or both sides of your head.  Pulsating or throbbing pain.  Severe pain that prevents daily activities.  Pain that is aggravated by any physical activity.  Nausea, vomiting, or both.  Dizziness.  Pain with exposure to bright lights, loud noises, or activity.  General sensitivity to bright lights, loud noises, or smells. Before you get a migraine, you may get warning signs that a migraine is coming (aura). An aura may include:  Seeing flashing lights.  Seeing bright spots, halos, or zigzag lines.  Having tunnel vision or blurred vision.  Having feelings of numbness or tingling.  Having trouble talking.  Having muscle weakness. DIAGNOSIS  A migraine headache is often diagnosed based on:  Symptoms.  Physical exam.  A CT scan or MRI of your head. These imaging tests cannot diagnose migraines, but they can help rule out other causes of headaches. TREATMENT Medicines may  be given for pain and nausea. Medicines can also be given to help prevent recurrent migraines.  HOME CARE INSTRUCTIONS  Only take over-the-counter or prescription medicines for pain or discomfort as directed by your health care provider. The use of long-term narcotics is not recommended.  Lie down in a dark, quiet room when you have a migraine.  Keep a journal to find out what may trigger your migraine headaches. For example, write down:  What you eat and drink.  How much sleep you get.  Any change to your diet or medicines.  Limit alcohol consumption.  Quit smoking if you smoke.  Get 7-9 hours of sleep, or as recommended by your health care provider.  Limit stress.  Keep lights dim if bright lights bother you and make your migraines worse. SEEK IMMEDIATE MEDICAL CARE IF:   Your migraine becomes severe.  You have a fever.  You have a stiff neck.  You have vision loss.  You have muscular weakness or loss of muscle control.  You start losing your balance or have trouble walking.  You feel faint or pass out.  You have severe symptoms that are different from your first symptoms. MAKE SURE YOU:   Understand these instructions.  Will watch your condition.  Will get help right away if you are not doing well or get worse.   This information is not intended to replace advice given to you by your health care provider. Make sure you discuss any questions you have with your health care provider.   Document Released: 02/19/2005 Document Revised: 03/12/2014 Document Reviewed: 10/27/2012 Elsevier Interactive  Patient Education 2016 [employer].

## 2016-01-31 ENCOUNTER — Other Ambulatory Visit (HOSPITAL_COMMUNITY)
Admission: RE | Admit: 2016-01-31 | Discharge: 2016-01-31 | Disposition: A | Discharge status: Home / Self Care | Payer: Federal, State, Local not specified - PPO | Source: Ambulatory Visit | Attending: Obstetrics and Gynecology | Admitting: Obstetrics and Gynecology

## 2016-01-31 ENCOUNTER — Other Ambulatory Visit: Payer: Self-pay | Admitting: Obstetrics and Gynecology

## 2016-01-31 DIAGNOSIS — Z01419 Encounter for gynecological examination (general) (routine) without abnormal findings: Secondary | ICD-10-CM | POA: Diagnosis present

## 2016-01-31 DIAGNOSIS — Z1151 Encounter for screening for human papillomavirus (HPV): Secondary | ICD-10-CM | POA: Diagnosis not present

## 2016-02-02 LAB — CYTOLOGY - PAP
DIAGNOSIS: NEGATIVE
HPV (WINDOPATH): NOT DETECTED

## 2016-08-30 DIAGNOSIS — M25562 Pain in left knee: Secondary | ICD-10-CM | POA: Diagnosis not present

## 2016-11-21 DIAGNOSIS — L0390 Cellulitis, unspecified: Secondary | ICD-10-CM | POA: Diagnosis not present

## 2016-11-21 DIAGNOSIS — K1120 Sialoadenitis, unspecified: Secondary | ICD-10-CM | POA: Diagnosis not present

## 2016-12-25 ENCOUNTER — Other Ambulatory Visit: Payer: Self-pay | Admitting: Obstetrics and Gynecology

## 2016-12-25 DIAGNOSIS — Z1231 Encounter for screening mammogram for malignant neoplasm of breast: Secondary | ICD-10-CM

## 2017-01-03 IMAGING — CR DG RIBS 2V*R*
2 series · 2 of 2 positions shown · non-contrast
Comparison: None.

CLINICAL DATA: Right-sided chest wall pain secondary to an injury
10 days ago.

EXAM:
RIGHT RIBS - 2 VIEW

[view not recorded (1 of 2)]
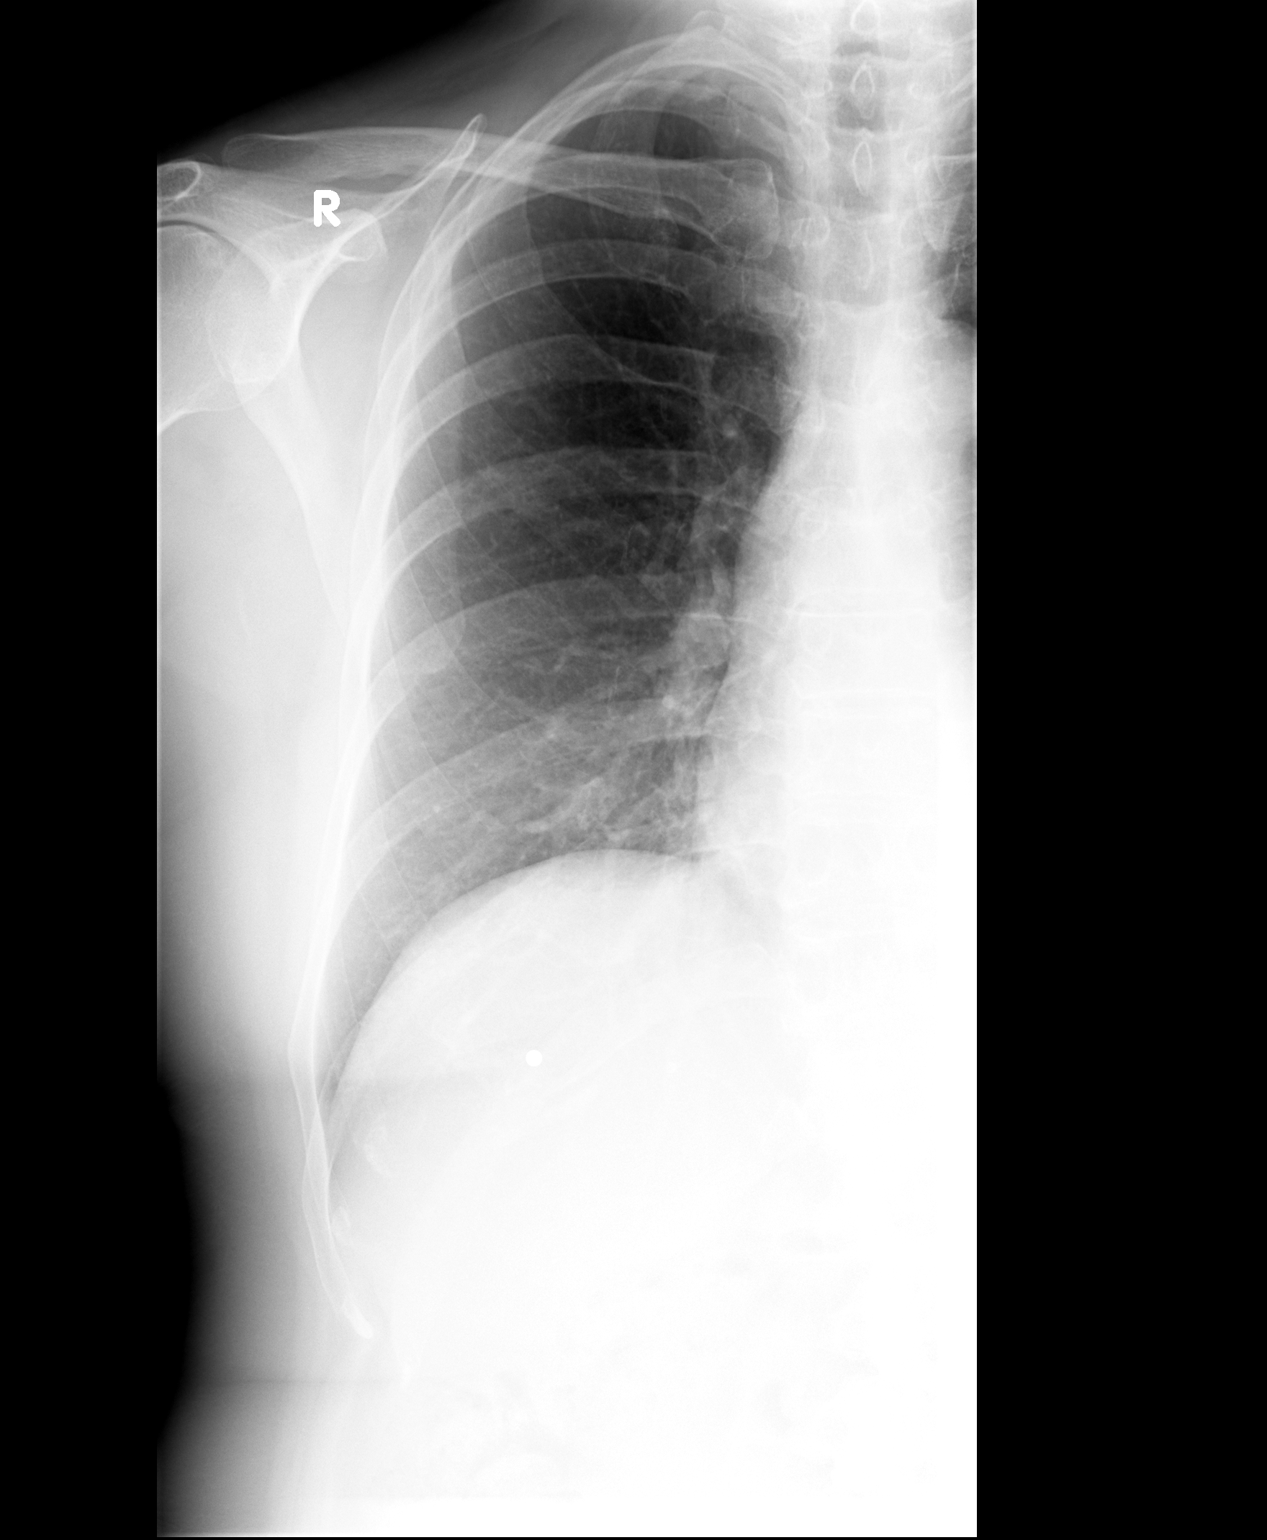

[view not recorded (2 of 2)]
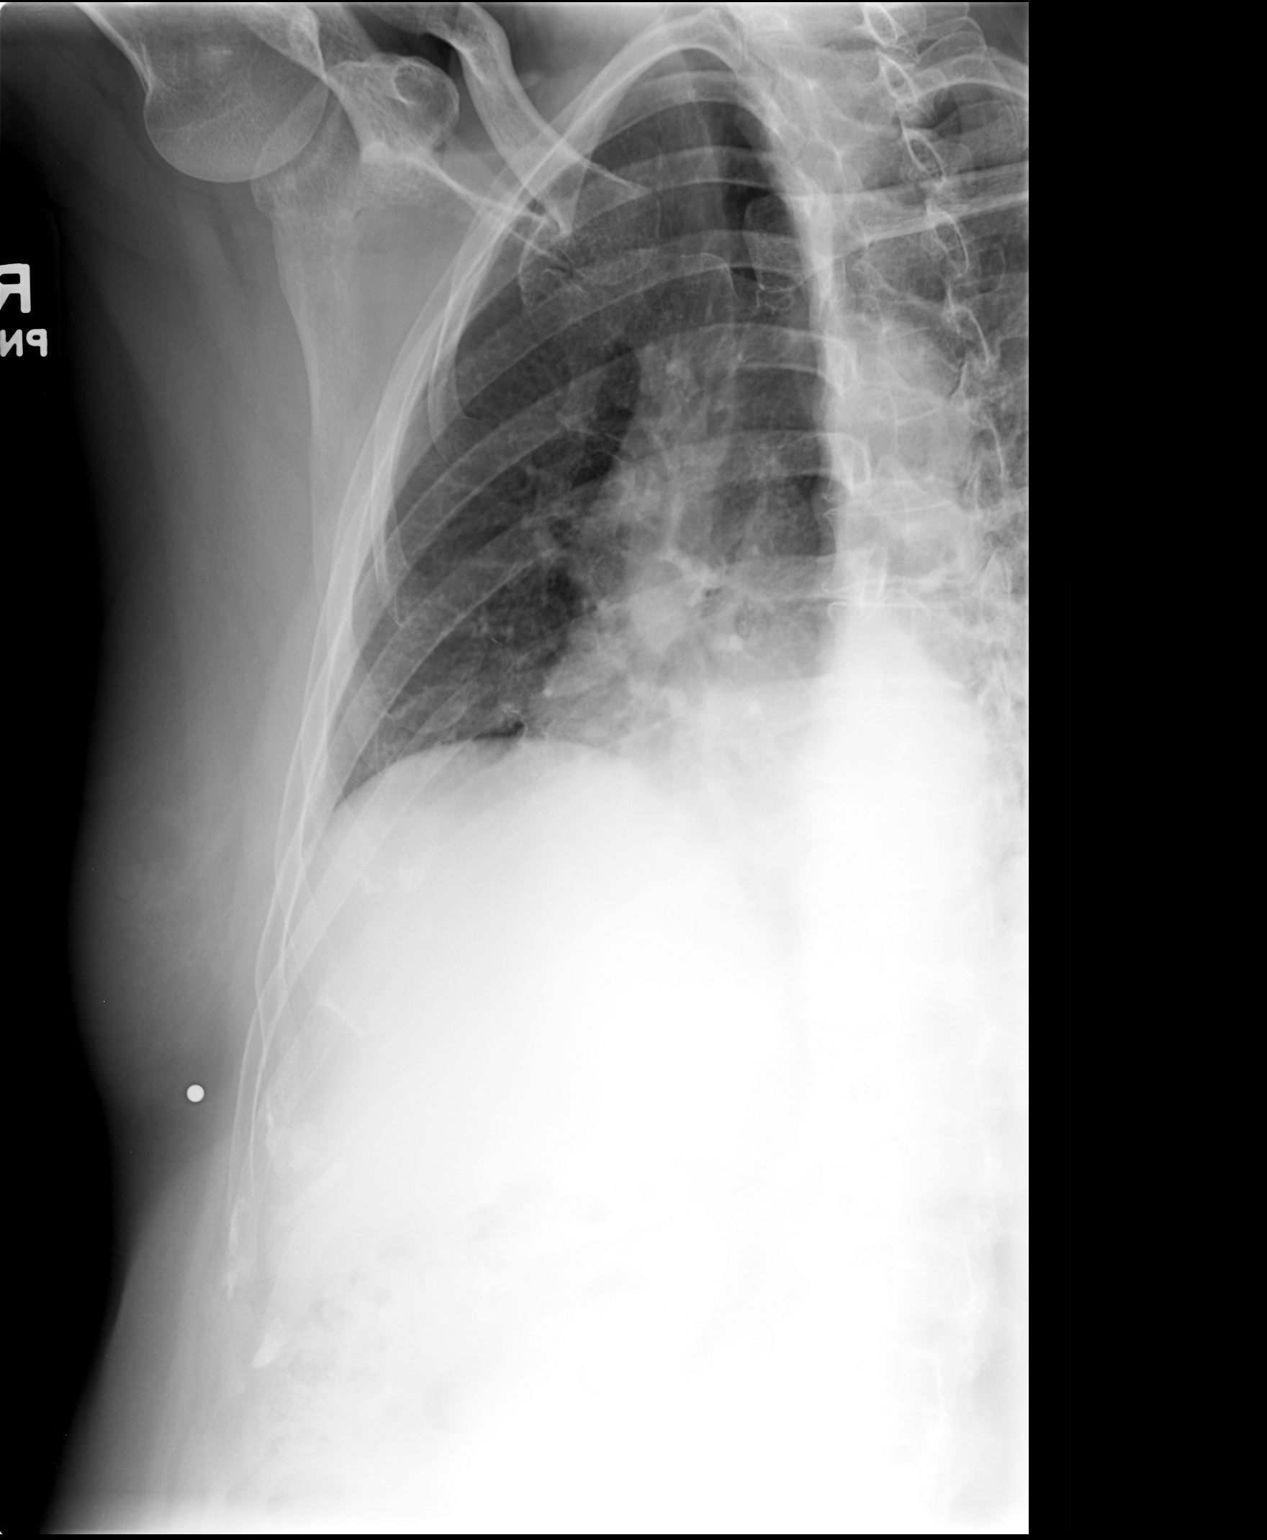

[2 of 2 positions shown; findings below may reference images not displayed]

FINDINGS: There is a subtle fracture of the anterior aspect of the right
seventh rib just lateral to the costochondral junction. Ribs are
otherwise normal. No pneumothorax or pleural effusion.
IMPRESSION: Fracture of the anterior aspect of the right seventh rib.

## 2017-01-15 ENCOUNTER — Ambulatory Visit: Payer: Self-pay

## 2017-04-19 DIAGNOSIS — J209 Acute bronchitis, unspecified: Secondary | ICD-10-CM | POA: Diagnosis not present

## 2017-05-21 ENCOUNTER — Ambulatory Visit
Admission: RE | Admit: 2017-05-21 | Discharge: 2017-05-21 | Disposition: A | Discharge status: Home / Self Care | Payer: Federal, State, Local not specified - PPO | Source: Ambulatory Visit | Attending: Obstetrics and Gynecology | Admitting: Obstetrics and Gynecology

## 2017-05-21 ENCOUNTER — Other Ambulatory Visit: Payer: Self-pay | Admitting: Obstetrics and Gynecology

## 2017-05-21 DIAGNOSIS — Z1231 Encounter for screening mammogram for malignant neoplasm of breast: Secondary | ICD-10-CM | POA: Diagnosis not present

## 2017-06-13 DIAGNOSIS — S9031XA Contusion of right foot, initial encounter: Secondary | ICD-10-CM | POA: Diagnosis not present

## 2018-04-07 DIAGNOSIS — F329 Major depressive disorder, single episode, unspecified: Secondary | ICD-10-CM | POA: Diagnosis not present

## 2018-04-07 DIAGNOSIS — Z8739 Personal history of other diseases of the musculoskeletal system and connective tissue: Secondary | ICD-10-CM | POA: Diagnosis not present

## 2018-04-07 DIAGNOSIS — Z01419 Encounter for gynecological examination (general) (routine) without abnormal findings: Secondary | ICD-10-CM | POA: Diagnosis not present

## 2018-04-07 DIAGNOSIS — N898 Other specified noninflammatory disorders of vagina: Secondary | ICD-10-CM | POA: Diagnosis not present

## 2018-04-07 DIAGNOSIS — N814 Uterovaginal prolapse, unspecified: Secondary | ICD-10-CM | POA: Diagnosis not present

## 2018-04-07 DIAGNOSIS — F411 Generalized anxiety disorder: Secondary | ICD-10-CM | POA: Diagnosis not present

## 2018-08-05 DIAGNOSIS — G4700 Insomnia, unspecified: Secondary | ICD-10-CM | POA: Diagnosis not present

## 2018-08-05 DIAGNOSIS — F411 Generalized anxiety disorder: Secondary | ICD-10-CM | POA: Diagnosis not present

## 2018-12-03 DIAGNOSIS — Z20828 Contact with and (suspected) exposure to other viral communicable diseases: Secondary | ICD-10-CM | POA: Diagnosis not present

## 2018-12-03 DIAGNOSIS — R05 Cough: Secondary | ICD-10-CM | POA: Diagnosis not present

## 2018-12-03 DIAGNOSIS — K219 Gastro-esophageal reflux disease without esophagitis: Secondary | ICD-10-CM | POA: Diagnosis not present

## 2019-07-13 ENCOUNTER — Other Ambulatory Visit: Payer: Self-pay | Admitting: Obstetrics and Gynecology

## 2019-07-13 DIAGNOSIS — Z1231 Encounter for screening mammogram for malignant neoplasm of breast: Secondary | ICD-10-CM

## 2019-07-20 DIAGNOSIS — Z01419 Encounter for gynecological examination (general) (routine) without abnormal findings: Secondary | ICD-10-CM | POA: Diagnosis not present

## 2019-07-23 ENCOUNTER — Other Ambulatory Visit: Payer: Self-pay | Admitting: Obstetrics and Gynecology

## 2019-07-23 DIAGNOSIS — R5381 Other malaise: Secondary | ICD-10-CM

## 2019-09-02 ENCOUNTER — Other Ambulatory Visit: Payer: Self-pay | Admitting: Obstetrics and Gynecology

## 2019-09-02 DIAGNOSIS — M8580 Other specified disorders of bone density and structure, unspecified site: Secondary | ICD-10-CM

## 2019-09-08 ENCOUNTER — Ambulatory Visit: Payer: Federal, State, Local not specified - PPO

## 2019-09-25 ENCOUNTER — Other Ambulatory Visit: Payer: Federal, State, Local not specified - PPO

## 2019-09-25 ENCOUNTER — Ambulatory Visit: Payer: Federal, State, Local not specified - PPO

## 2019-10-12 DIAGNOSIS — M2570 Osteophyte, unspecified joint: Secondary | ICD-10-CM | POA: Diagnosis not present

## 2019-12-17 ENCOUNTER — Ambulatory Visit
Admission: RE | Admit: 2019-12-17 | Discharge: 2019-12-17 | Disposition: A | Discharge status: Home / Self Care | Payer: Federal, State, Local not specified - PPO | Source: Ambulatory Visit | Attending: Obstetrics and Gynecology | Admitting: Obstetrics and Gynecology

## 2019-12-17 ENCOUNTER — Other Ambulatory Visit: Payer: Self-pay

## 2019-12-17 DIAGNOSIS — M8580 Other specified disorders of bone density and structure, unspecified site: Secondary | ICD-10-CM

## 2019-12-17 DIAGNOSIS — Z780 Asymptomatic menopausal state: Secondary | ICD-10-CM | POA: Diagnosis not present

## 2019-12-17 DIAGNOSIS — Z1231 Encounter for screening mammogram for malignant neoplasm of breast: Secondary | ICD-10-CM | POA: Diagnosis not present

## 2019-12-17 DIAGNOSIS — M8589 Other specified disorders of bone density and structure, multiple sites: Secondary | ICD-10-CM | POA: Diagnosis not present

## 2021-03-07 DIAGNOSIS — J019 Acute sinusitis, unspecified: Secondary | ICD-10-CM | POA: Diagnosis not present

## 2021-03-27 DIAGNOSIS — Z01419 Encounter for gynecological examination (general) (routine) without abnormal findings: Secondary | ICD-10-CM | POA: Diagnosis not present

## 2021-08-04 DIAGNOSIS — S63592A Other specified sprain of left wrist, initial encounter: Secondary | ICD-10-CM | POA: Diagnosis not present

## 2021-08-07 DIAGNOSIS — N952 Postmenopausal atrophic vaginitis: Secondary | ICD-10-CM | POA: Diagnosis not present

## 2021-08-07 DIAGNOSIS — N950 Postmenopausal bleeding: Secondary | ICD-10-CM | POA: Diagnosis not present

## 2021-09-11 DIAGNOSIS — N950 Postmenopausal bleeding: Secondary | ICD-10-CM | POA: Diagnosis not present

## 2021-09-11 DIAGNOSIS — N952 Postmenopausal atrophic vaginitis: Secondary | ICD-10-CM | POA: Diagnosis not present

## 2022-03-29 ENCOUNTER — Other Ambulatory Visit: Payer: Self-pay | Admitting: Nurse Practitioner

## 2022-03-29 ENCOUNTER — Other Ambulatory Visit: Payer: Self-pay | Admitting: Obstetrics and Gynecology

## 2022-03-29 DIAGNOSIS — Z4689 Encounter for fitting and adjustment of other specified devices: Secondary | ICD-10-CM | POA: Diagnosis not present

## 2022-03-29 DIAGNOSIS — Z01419 Encounter for gynecological examination (general) (routine) without abnormal findings: Secondary | ICD-10-CM | POA: Diagnosis not present

## 2022-03-29 DIAGNOSIS — Z1231 Encounter for screening mammogram for malignant neoplasm of breast: Secondary | ICD-10-CM

## 2022-03-29 DIAGNOSIS — M85859 Other specified disorders of bone density and structure, unspecified thigh: Secondary | ICD-10-CM

## 2022-04-05 ENCOUNTER — Inpatient Hospital Stay: Admission: RE | Admit: 2022-04-05 | Payer: Federal, State, Local not specified - PPO | Source: Ambulatory Visit

## 2022-04-10 ENCOUNTER — Ambulatory Visit
Admission: RE | Admit: 2022-04-10 | Discharge: 2022-04-10 | Disposition: A | Discharge status: Home / Self Care | Payer: Federal, State, Local not specified - PPO | Source: Ambulatory Visit | Attending: Obstetrics and Gynecology | Admitting: Obstetrics and Gynecology

## 2022-04-10 DIAGNOSIS — Z1231 Encounter for screening mammogram for malignant neoplasm of breast: Secondary | ICD-10-CM

## 2022-04-23 DIAGNOSIS — Z4689 Encounter for fitting and adjustment of other specified devices: Secondary | ICD-10-CM | POA: Diagnosis not present

## 2022-05-01 DIAGNOSIS — R030 Elevated blood-pressure reading, without diagnosis of hypertension: Secondary | ICD-10-CM | POA: Diagnosis not present

## 2022-05-01 DIAGNOSIS — Z1330 Encounter for screening examination for mental health and behavioral disorders, unspecified: Secondary | ICD-10-CM | POA: Diagnosis not present

## 2022-05-01 DIAGNOSIS — F330 Major depressive disorder, recurrent, mild: Secondary | ICD-10-CM | POA: Diagnosis not present

## 2022-05-01 DIAGNOSIS — K219 Gastro-esophageal reflux disease without esophagitis: Secondary | ICD-10-CM | POA: Diagnosis not present

## 2022-06-12 DIAGNOSIS — Z130 Encounter for screening for diseases of the blood and blood-forming organs and certain disorders involving the immune mechanism: Secondary | ICD-10-CM | POA: Diagnosis not present

## 2022-06-12 DIAGNOSIS — R000 Tachycardia, unspecified: Secondary | ICD-10-CM | POA: Diagnosis not present

## 2022-06-12 DIAGNOSIS — R638 Other symptoms and signs concerning food and fluid intake: Secondary | ICD-10-CM | POA: Diagnosis not present

## 2022-06-12 DIAGNOSIS — Z0000 Encounter for general adult medical examination without abnormal findings: Secondary | ICD-10-CM | POA: Diagnosis not present

## 2022-06-12 DIAGNOSIS — F330 Major depressive disorder, recurrent, mild: Secondary | ICD-10-CM | POA: Diagnosis not present

## 2022-06-12 DIAGNOSIS — Z6830 Body mass index (BMI) 30.0-30.9, adult: Secondary | ICD-10-CM | POA: Diagnosis not present

## 2022-06-12 DIAGNOSIS — Z13220 Encounter for screening for lipoid disorders: Secondary | ICD-10-CM | POA: Diagnosis not present

## 2022-09-07 ENCOUNTER — Inpatient Hospital Stay: Admission: RE | Admit: 2022-09-07 | Payer: Federal, State, Local not specified - PPO | Source: Ambulatory Visit

## 2023-01-23 ENCOUNTER — Other Ambulatory Visit: Payer: Self-pay | Admitting: Nurse Practitioner

## 2023-01-23 DIAGNOSIS — M85859 Other specified disorders of bone density and structure, unspecified thigh: Secondary | ICD-10-CM

## 2023-01-25 ENCOUNTER — Other Ambulatory Visit: Payer: Federal, State, Local not specified - PPO

## 2023-09-17 ENCOUNTER — Other Ambulatory Visit: Payer: Federal, State, Local not specified - PPO

## 2023-11-15 ENCOUNTER — Emergency Department (HOSPITAL_COMMUNITY)

## 2023-11-15 ENCOUNTER — Encounter (HOSPITAL_COMMUNITY): Payer: Self-pay

## 2023-11-15 ENCOUNTER — Emergency Department (HOSPITAL_COMMUNITY)
Admission: EM | Admit: 2023-11-15 | Discharge: 2023-11-15 | Disposition: A | Discharge status: Home / Self Care | Attending: Emergency Medicine | Admitting: Emergency Medicine

## 2023-11-15 ENCOUNTER — Other Ambulatory Visit: Payer: Self-pay

## 2023-11-15 DIAGNOSIS — Z79899 Other long term (current) drug therapy: Secondary | ICD-10-CM | POA: Diagnosis not present

## 2023-11-15 DIAGNOSIS — R519 Headache, unspecified: Secondary | ICD-10-CM | POA: Diagnosis not present

## 2023-11-15 DIAGNOSIS — H538 Other visual disturbances: Secondary | ICD-10-CM | POA: Insufficient documentation

## 2023-11-15 DIAGNOSIS — R030 Elevated blood-pressure reading, without diagnosis of hypertension: Secondary | ICD-10-CM | POA: Insufficient documentation

## 2023-11-15 DIAGNOSIS — Z853 Personal history of malignant neoplasm of breast: Secondary | ICD-10-CM | POA: Insufficient documentation

## 2023-11-15 DIAGNOSIS — R42 Dizziness and giddiness: Secondary | ICD-10-CM | POA: Diagnosis present

## 2023-11-15 DIAGNOSIS — I444 Left anterior fascicular block: Secondary | ICD-10-CM | POA: Diagnosis not present

## 2023-11-15 DIAGNOSIS — R791 Abnormal coagulation profile: Secondary | ICD-10-CM | POA: Diagnosis not present

## 2023-11-15 DIAGNOSIS — H579 Unspecified disorder of eye and adnexa: Secondary | ICD-10-CM

## 2023-11-15 LAB — I-STAT CHEM 8, ED
BUN: 10 mg/dL (ref 8–23)
Calcium, Ion: 1.19 mmol/L (ref 1.15–1.40)
Chloride: 106 mmol/L (ref 98–111)
Creatinine, Ser: 0.7 mg/dL (ref 0.44–1.00)
Glucose, Bld: 111 mg/dL — ABNORMAL HIGH (ref 70–99)
HCT: 50 % — ABNORMAL HIGH (ref 36.0–46.0)
Hemoglobin: 17 g/dL — ABNORMAL HIGH (ref 12.0–15.0)
Potassium: 4.1 mmol/L (ref 3.5–5.1)
Sodium: 138 mmol/L (ref 135–145)
TCO2: 20 mmol/L — ABNORMAL LOW (ref 22–32)

## 2023-11-15 LAB — ETHANOL: Alcohol, Ethyl (B): <15 mg/dL (ref <15)

## 2023-11-15 LAB — DIFFERENTIAL
Abs Immature Granulocytes: 0.02 K/uL (ref 0.00–0.07)
Basophils Absolute: 0 K/uL (ref 0.0–0.1)
Basophils Relative: 0 %
Eosinophils Absolute: 0 K/uL (ref 0.0–0.5)
Eosinophils Relative: 0 %
Immature Granulocytes: 0 %
Lymphocytes Relative: 25 %
Lymphs Abs: 1.8 K/uL (ref 0.7–4.0)
Monocytes Absolute: 0.6 K/uL (ref 0.1–1.0)
Monocytes Relative: 8 %
Neutro Abs: 4.8 K/uL (ref 1.7–7.7)
Neutrophils Relative %: 67 %

## 2023-11-15 LAB — COMPREHENSIVE METABOLIC PANEL WITH GFR
ALT: 23 U/L (ref 0–44)
AST: 23 U/L (ref 15–41)
Albumin: 4 g/dL (ref 3.5–5.0)
Alkaline Phosphatase: 99 U/L (ref 38–126)
Anion gap: 13 (ref 5–15)
BUN: 9 mg/dL (ref 8–23)
CO2: 18 mmol/L — ABNORMAL LOW (ref 22–32)
Calcium: 9.6 mg/dL (ref 8.9–10.3)
Chloride: 104 mmol/L (ref 98–111)
Creatinine, Ser: 0.73 mg/dL (ref 0.44–1.00)
GFR, Estimated: >60 mL/min (ref >60)
Glucose, Bld: 107 mg/dL — ABNORMAL HIGH (ref 70–99)
Potassium: 4 mmol/L (ref 3.5–5.1)
Sodium: 135 mmol/L (ref 135–145)
Total Bilirubin: 0.9 mg/dL (ref 0.0–1.2)
Total Protein: 6.9 g/dL (ref 6.5–8.1)

## 2023-11-15 LAB — CBC
HCT: 49.7 % — ABNORMAL HIGH (ref 36.0–46.0)
Hemoglobin: 16.9 g/dL — ABNORMAL HIGH (ref 12.0–15.0)
MCH: 30.2 pg (ref 26.0–34.0)
MCHC: 34 g/dL (ref 30.0–36.0)
MCV: 88.9 fL (ref 80.0–100.0)
Platelets: 289 K/uL (ref 150–400)
RBC: 5.59 MIL/uL — ABNORMAL HIGH (ref 3.87–5.11)
RDW: 12.4 % (ref 11.5–15.5)
WBC: 7.2 K/uL (ref 4.0–10.5)
nRBC: 0 % (ref 0.0–0.2)

## 2023-11-15 LAB — PROTIME-INR
INR: 1 (ref 0.8–1.2)
Prothrombin Time: 13.5 s (ref 11.4–15.2)

## 2023-11-15 LAB — CBG MONITORING, ED: Glucose-Capillary: 108 mg/dL — ABNORMAL HIGH (ref 70–99)

## 2023-11-15 LAB — APTT: aPTT: 31 s (ref 24–36)

## 2023-11-15 MED ORDER — METOCLOPRAMIDE HCL 5 MG/5ML PO SOLN
10.0000 mg | Freq: Once | ORAL | Status: AC
Start: 2023-11-15 — End: 2023-11-15
  Administered 2023-11-15: 10 mg via ORAL
  Filled 2023-11-15: qty 10

## 2023-11-15 MED ORDER — GADOBUTROL 1 MMOL/ML IV SOLN
7.5000 mL | Freq: Once | INTRAVENOUS | Status: AC | PRN
  Administered 2023-11-15: 7.5 mL via INTRAVENOUS

## 2023-11-15 MED ORDER — KETOROLAC TROMETHAMINE 15 MG/ML IJ SOLN
15.0000 mg | Freq: Once | INTRAMUSCULAR | Status: AC
  Administered 2023-11-15: 15 mg via INTRAVENOUS
  Filled 2023-11-15: qty 1

## 2023-11-15 MED ORDER — SODIUM CHLORIDE 0.9 % IV BOLUS
500.0000 mL | Freq: Once | INTRAVENOUS | Status: AC
Start: 2023-11-15 — End: 2023-11-15
  Administered 2023-11-15: 500 mL via INTRAVENOUS

## 2023-11-15 MED ORDER — MECLIZINE HCL 25 MG PO TABS: 25.0000 mg | ORAL_TABLET | Freq: Two times a day (BID) | ORAL | Status: DC | PRN

## 2023-11-15 NOTE — ED Triage Notes (Signed)
 Pt BIBA from home as code stroke. LKW [id number], pt reports dizziness and frontal HA. EMS reports right side gaze, no other deficits noted. Pt not on blood thinners, no hx of stroke or TIA

## 2023-11-15 NOTE — ED Notes (Signed)
 Pt ambulated to the bathroom with no issues. No urine sample given at this time, will encourage pt to urinate again after fluid administration (30 min).

## 2023-11-15 NOTE — ED Provider Notes (Addendum)
 [place] EMERGENCY DEPARTMENT AT [facility] Provider Note   CSN: [id number] Arrival date & time: 11/15/23  1746  An emergency department physician performed an initial assessment on this suspected stroke patient at 1747.  Patient presents with: Code Stroke   Kristen Burke is a 69 y.o. female.   Patient presents via EMS as code stroke activation. Pt had c/o vague sense of dizziness and frontal headache, and felt as if she wanted to gaze to the right. No double vision, amaurosis or visual field cut. No eye pain or redness or tearing. Denies facial or extremity numbness or weakness. No problems w balance, no falls. No syncope. Denies chest pain or palpitations. No sob or unusual doe. No fever or chills. Headache dull, mild, gradual onset, currently improved/resolved. No acute, abrupt or thunderclap type head pain. No neck pain or stiffness. Pt currently indicates no visual field issues, change in vision, or change in gaze or eye deviation.   The history is provided by the patient, medical records and the EMS personnel.       Prior to Admission medications   Medication Sig Start Date End Date Taking? Authorizing Provider  alendronate  (FOSAMAX ) 70 MG tablet TAKE 1 TABLET BY MOUTH EVERY 7 DAYS WITH A FULL GLASS OF WATER ON AN EMPTY STOMACH 08/19/12   Khan, Kalsoom, MD  escitalopram (LEXAPRO) 10 MG tablet Take 10 mg by mouth daily.    [provider]  ibuprofen  (ADVIL ,MOTRIN ) 200 MG tablet Take 400 mg by mouth every 4 (four) hours as needed.    [provider]  ondansetron  (ZOFRAN ) 4 MG tablet Take 1 tablet (4 mg total) by mouth every 8 (eight) hours as needed for nausea or vomiting. 05/24/15   Nafziger, Darleene, NP  Probiotic Product (ALIGN) 4 MG CAPS Take 1 capsule by mouth daily. 06/06/11   Aneita Gwendlyn DASEN, MD  rizatriptan  (MAXALT ) 10 MG tablet Take 1 tablet (10 mg total) by mouth as needed for migraine. May repeat in 2 hours if needed 05/24/15   Merna Darleene, NP   traZODone  (DESYREL ) 50 MG tablet Take 0.5-1 tablets (25-50 mg total) by mouth at bedtime as needed for sleep. Patient not taking: Reported on 05/24/2015 08/03/14   Jame Maude FALCON, MD    Allergies: Sulfamethoxazole    Review of Systems  Constitutional:  Negative for chills and fever.  HENT:  Negative for sore throat.   Eyes:  Negative for photophobia, pain and redness.  Respiratory:  Negative for cough and shortness of breath.   Cardiovascular:  Negative for chest pain, palpitations and leg swelling.  Gastrointestinal:  Negative for abdominal pain, nausea and vomiting.  Genitourinary:  Negative for dysuria and flank pain.  Musculoskeletal:  Negative for back pain and neck pain.  Neurological:  Negative for syncope, speech difficulty, weakness and numbness.  Psychiatric/Behavioral:  Negative for confusion.     Updated Vital Signs BP (!) 157/93   Pulse 94   Temp 98.5 F (36.9 C) (Oral)   Resp 15   Ht 1.6 m (5' 3)   Wt 76.4 kg   SpO2 99%   BMI 29.84 kg/m   Physical Exam Vitals and nursing note reviewed.  Constitutional:      Appearance: Normal appearance. She is well-developed.  HENT:     Head: Atraumatic.     Nose: Nose normal.     Mouth/Throat:     Mouth: Mucous membranes are moist.  Eyes:     General: No scleral icterus.  Extraocular Movements: Extraocular movements intact.     Conjunctiva/sclera: Conjunctivae normal.     Pupils: Pupils are equal, round, and reactive to light.  Neck:     Vascular: No carotid bruit.     Trachea: No tracheal deviation.     Comments: Trachea midline, thyroid not grossly enlarged or tender. No neck stiffness or rigidity. No bruits.  Cardiovascular:     Rate and Rhythm: Normal rate and regular rhythm.     Pulses: Normal pulses.     Heart sounds: Normal heart sounds. No murmur heard.    No friction rub. No gallop.  Pulmonary:     Effort: Pulmonary effort is normal. No respiratory distress.     Breath sounds: Normal breath  sounds.  Abdominal:     General: Bowel sounds are normal. There is no distension.     Palpations: Abdomen is soft. There is no mass.     Tenderness: There is no abdominal tenderness. There is no guarding.  Musculoskeletal:        General: No swelling or tenderness.     Cervical back: Normal range of motion and neck supple. No rigidity. No muscular tenderness.     Right lower leg: No edema.     Left lower leg: No edema.  Skin:    General: Skin is warm and dry.     Findings: No rash.  Neurological:     Mental Status: She is alert.     Comments: Alert, speech normal. Motor/sens grossly intact bil. Stre 5/5. Sens grossly intact. No pronator drift.   Psychiatric:        Mood and Affect: Mood normal.     (all labs ordered are listed, but only abnormal results are displayed) Results for orders placed or performed during the hospital encounter of 11/15/23  CBG monitoring, ED   Collection Time: 11/15/23  5:47 PM  Result Value Ref Range   Glucose-Capillary 108 (H) 70 - 99 mg/dL  Protime-INR   Collection Time: 11/15/23  5:48 PM  Result Value Ref Range   Prothrombin Time 13.5 11.4 - 15.2 seconds   INR 1.0 0.8 - 1.2  APTT   Collection Time: 11/15/23  5:48 PM  Result Value Ref Range   aPTT 31 24 - 36 seconds  CBC   Collection Time: 11/15/23  5:48 PM  Result Value Ref Range   WBC 7.2 4.0 - 10.5 K/uL   RBC 5.59 (H) 3.87 - 5.11 MIL/uL   Hemoglobin 16.9 (H) 12.0 - 15.0 g/dL   HCT 50.2 (H) 63.9 - 53.9 %   MCV 88.9 80.0 - 100.0 fL   MCH 30.2 26.0 - 34.0 pg   MCHC 34.0 30.0 - 36.0 g/dL   RDW 87.5 88.4 - 84.4 %   Platelets 289 150 - 400 K/uL   nRBC 0.0 0.0 - 0.2 %  Differential   Collection Time: 11/15/23  5:48 PM  Result Value Ref Range   Neutrophils Relative % 67 %   Neutro Abs 4.8 1.7 - 7.7 K/uL   Lymphocytes Relative 25 %   Lymphs Abs 1.8 0.7 - 4.0 K/uL   Monocytes Relative 8 %   Monocytes Absolute 0.6 0.1 - 1.0 K/uL   Eosinophils Relative 0 %   Eosinophils Absolute 0.0 0.0 -  0.5 K/uL   Basophils Relative 0 %   Basophils Absolute 0.0 0.0 - 0.1 K/uL   Immature Granulocytes 0 %   Abs Immature Granulocytes 0.02 0.00 - 0.07 K/uL  Comprehensive metabolic panel  Collection Time: 11/15/23  5:48 PM  Result Value Ref Range   Sodium 135 135 - 145 mmol/L   Potassium 4.0 3.5 - 5.1 mmol/L   Chloride 104 98 - 111 mmol/L   CO2 18 (L) 22 - 32 mmol/L   Glucose, Bld 107 (H) 70 - 99 mg/dL   BUN 9 8 - 23 mg/dL   Creatinine, Ser 9.26 0.44 - 1.00 mg/dL   Calcium 9.6 8.9 - 89.6 mg/dL   Total Protein 6.9 6.5 - 8.1 g/dL   Albumin 4.0 3.5 - 5.0 g/dL   AST 23 15 - 41 U/L   ALT 23 0 - 44 U/L   Alkaline Phosphatase 99 38 - 126 U/L   Total Bilirubin 0.9 0.0 - 1.2 mg/dL   GFR, Estimated >39 >39 mL/min   Anion gap 13 5 - 15  I-stat chem 8, ED   Collection Time: 11/15/23  5:52 PM  Result Value Ref Range   Sodium 138 135 - 145 mmol/L   Potassium 4.1 3.5 - 5.1 mmol/L   Chloride 106 98 - 111 mmol/L   BUN 10 8 - 23 mg/dL   Creatinine, Ser 9.29 0.44 - 1.00 mg/dL   Glucose, Bld 888 (H) 70 - 99 mg/dL   Calcium, Ion 8.80 8.84 - 1.40 mmol/L   TCO2 20 (L) 22 - 32 mmol/L   Hemoglobin 17.0 (H) 12.0 - 15.0 g/dL   HCT 49.9 (H) 63.9 - 53.9 %  Ethanol   Collection Time: 11/15/23  6:12 PM  Result Value Ref Range   Alcohol, Ethyl (B) <15 <15 mg/dL   MR BRAIN W WO CONTRAST Result Date: 11/15/2023 EXAM: MRI BRAIN WITH AND WITHOUT CONTRAST 11/15/2023 07:28:54 PM TECHNIQUE: Multiplanar multisequence MRI of the head/brain was performed with and without the administration of intravenous contrast. COMPARISON: CT head without contrast 11/15/2023. CLINICAL HISTORY: 68 year old female with history of migraines and breast cancer presents with one week of headache, 24 hours of dizziness, and right gaze lasting about 15 minutes. Currently reporting dizziness and headache rated 5 out of 10. No focal deficits, nausea, or vomiting. Ibuprofen  taken without relief. Stroke suspected. FINDINGS: BRAIN AND  VENTRICLES: No acute infarct. No acute intracranial hemorrhage. No mass effect or midline shift. No hydrocephalus. The sella is unremarkable. Normal flow voids. No mass or abnormal enhancement. Periventricular and scattered subcortical T2 hyperintensities are mildly advanced for age. No associated pathologic enhancement is present. ORBITS: No acute abnormality. SINUSES: No acute abnormality. BONES AND SOFT TISSUES: Normal bone marrow signal and enhancement. No acute soft tissue abnormality. IMPRESSION: 1. No acute intracranial abnormality. 2. Mildly advanced periventricular and scattered subcortical T2 hyperintensities for age, without associated pathologic enhancement. These are nonspecific but can be seen in the setting of chronic microvascular ischemia, a demyelinating process, or chronic headaches. Electronically signed by: Lonni Necessary MD 11/15/2023 07:43 PM EDT RP Workstation: [id number]   CT HEAD CODE STROKE WO CONTRAST Result Date: 11/15/2023 EXAM: CT HEAD WITHOUT CONTRAST 11/15/2023 05:53:00 PM TECHNIQUE: CT of the head was performed without the administration of intravenous contrast. Automated exposure control, iterative reconstruction, and/or weight based adjustment of the mA/kV was utilized to reduce the radiation dose to as low as reasonably achievable. COMPARISON: None available. CLINICAL HISTORY: Neuro deficit, acute, stroke suspected. Chief complaints; code stroke; CT HEAD CODE STROKE WO CONTRAST; Neuro deficit, acute, stroke suspected. FINDINGS: BRAIN AND VENTRICLES: No acute hemorrhage. No evidence of acute infarct. No hydrocephalus. No extra-axial collection. No mass effect or midline shift. ORBITS: No acute  abnormality. SINUSES: No acute abnormality. SOFT TISSUES AND SKULL: No acute soft tissue abnormality. No skull fracture. [place] stroke program early CT (aspect) score ----- Ganglionic (caudate, ic, Lentiform Nucleus, insula, M1-m3): 7 Supraganglionic (m4-m6): 3 Total: 10 [place]   604 pm IMPRESSION: 1. No acute intracranial abnormality related to the suspected stroke. ASPECTS 10 The pertinent results were texted to Dr. Vanessa via the [facility] system at 6:06pm. Electronically signed by: Lonni Necessary MD 11/15/2023 06:07 PM EDT RP Workstation: [id number]     EKG: EKG Interpretation Date/Time:  Friday November 15 2023 18:18:55 EDT Ventricular Rate:  85 PR Interval:  116 QRS Duration:  102 QT Interval:  388 QTC Calculation: 462 R Axis:   -49  Text Interpretation: Sinus rhythm Borderline short PR interval Left anterior fascicular block Left ventricular hypertrophy Confirmed by Bernard Drivers ([id number]) on 11/15/2023 7:06:58 PM  Radiology: MR BRAIN W WO CONTRAST Result Date: 11/15/2023 EXAM: MRI BRAIN WITH AND WITHOUT CONTRAST 11/15/2023 07:28:54 PM TECHNIQUE: Multiplanar multisequence MRI of the head/brain was performed with and without the administration of intravenous contrast. COMPARISON: CT head without contrast 11/15/2023. CLINICAL HISTORY: 68 year old female with history of migraines and breast cancer presents with one week of headache, 24 hours of dizziness, and right gaze lasting about 15 minutes. Currently reporting dizziness and headache rated 5 out of 10. No focal deficits, nausea, or vomiting. Ibuprofen  taken without relief. Stroke suspected. FINDINGS: BRAIN AND VENTRICLES: No acute infarct. No acute intracranial hemorrhage. No mass effect or midline shift. No hydrocephalus. The sella is unremarkable. Normal flow voids. No mass or abnormal enhancement. Periventricular and scattered subcortical T2 hyperintensities are mildly advanced for age. No associated pathologic enhancement is present. ORBITS: No acute abnormality. SINUSES: No acute abnormality. BONES AND SOFT TISSUES: Normal bone marrow signal and enhancement. No acute soft tissue abnormality. IMPRESSION: 1. No acute intracranial abnormality. 2. Mildly advanced periventricular and scattered subcortical T2  hyperintensities for age, without associated pathologic enhancement. These are nonspecific but can be seen in the setting of chronic microvascular ischemia, a demyelinating process, or chronic headaches. Electronically signed by: Lonni Necessary MD 11/15/2023 07:43 PM EDT RP Workstation: [id number]   CT HEAD CODE STROKE WO CONTRAST Result Date: 11/15/2023 EXAM: CT HEAD WITHOUT CONTRAST 11/15/2023 05:53:00 PM TECHNIQUE: CT of the head was performed without the administration of intravenous contrast. Automated exposure control, iterative reconstruction, and/or weight based adjustment of the mA/kV was utilized to reduce the radiation dose to as low as reasonably achievable. COMPARISON: None available. CLINICAL HISTORY: Neuro deficit, acute, stroke suspected. Chief complaints; code stroke; CT HEAD CODE STROKE WO CONTRAST; Neuro deficit, acute, stroke suspected. FINDINGS: BRAIN AND VENTRICLES: No acute hemorrhage. No evidence of acute infarct. No hydrocephalus. No extra-axial collection. No mass effect or midline shift. ORBITS: No acute abnormality. SINUSES: No acute abnormality. SOFT TISSUES AND SKULL: No acute soft tissue abnormality. No skull fracture. [place] stroke program early CT (aspect) score ----- Ganglionic (caudate, ic, Lentiform Nucleus, insula, M1-m3): 7 Supraganglionic (m4-m6): 3 Total: 10 [place]  604 pm IMPRESSION: 1. No acute intracranial abnormality related to the suspected stroke. ASPECTS 10 The pertinent results were texted to Dr. Vanessa via the [facility] system at 6:06pm. Electronically signed by: Lonni Necessary MD 11/15/2023 06:07 PM EDT RP Workstation: [id number]     Procedures   Medications Ordered in the ED  meclizine  (ANTIVERT ) tablet 25 mg (has no administration in time range)  ketorolac  (TORADOL ) 15 MG/ML injection 15 mg (15 mg Intravenous Given 11/15/23 1949)    And  metoCLOPramide  (  REGLAN ) 5 MG/5ML solution 10 mg (10 mg Oral Given 11/15/23 1949)    And  sodium  chloride 0.9 % bolus 500 mL (500 mLs Intravenous New Bag/Given 11/15/23 1957)  gadobutrol  (GADAVIST ) 1 MMOL/ML injection 7.5 mL (7.5 mLs Intravenous Contrast Given 11/15/23 1928)                                    Medical Decision Making Problems Addressed: Dizziness: acute illness or injury with systemic symptoms that poses a threat to life or bodily functions Elevated blood pressure reading: acute illness or injury Frontal headache: acute illness or injury Visual symptoms: acute illness or injury with systemic symptoms  Amount and/or Complexity of Data Reviewed Independent Historian: EMS    Details: hx External Data Reviewed: notes. Labs: ordered. Decision-making details documented in ED Course. Radiology: ordered and independent interpretation performed. Decision-making details documented in ED Course. ECG/medicine tests: ordered and independent interpretation performed. Decision-making details documented in ED Course. Discussion of management or test interpretation with external provider(s): neurology  Risk Decision regarding hospitalization.   Iv ns. Continuous pulse ox and cardiac monitoring. Labs ordered/sent. Imaging ordered.   Differential diagnosis includes stroke, atypical migraine, etc. Dispo decision including potential need for admission considered - will get labs and imaging and reassess.   Reviewed nursing notes and prior charts for additional history. External reports reviewed. Additional history from: EMS.  Pt was code cva activation pta. Neurology was emergently consulted and evaluated - Dr Aron K indicates if MRI neg for cva, rec d/c to home w outpatient pcp/neurology f/u.   Cardiac monitor: sinus rhythm, rate 88.  Labs reviewed/interpreted by me - wbc 7,m hct 49. Chem largely unremarkable.   CT reviewed/interpreted by me - no hem.   MRI reviewed/interpreted by me - no cva.   Earlier symptoms completely resolved. Pt currently appears stable for ED d/c.    Recheck 2200, full/normal EOM, PERRL. Denies double vision, visual field cut, or blurry vision.  Pt remains appearing stable for ED d/c.   Rec close pcp/neurology f/u.  Return precautions provided.       Final diagnoses:  None    ED Discharge Orders     None           Bernard Drivers, MD 11/15/23 2201

## 2023-11-15 NOTE — ED Notes (Signed)
 Pt c/o and nurse sees gaze to the right. Pt has no c/o vision loss,blurred vision or pain. MD advised and he reported to bedside.

## 2023-11-15 NOTE — Consult Note (Signed)
 NEUROLOGY CONSULT NOTE   Date of service: November 15, 2023 Patient Name: Kristen Burke MRN:  [id number] DOB:  Jul 04, 1955 Chief Complaint: Code Stroke Requesting Provider: No att. providers found  History of Present Illness  Kristen Burke is a 68 y.o. female with hx of migraines, breast cancer. One week of headache, 24 hours of dizziness, and right gaze starting at 1630 and lasting for about 15 minutes.  She is currently reporting dizziness and a headache that is a 5 out of 10.  She does not currently have any focal deficits.  She denies nausea or vomiting.  She has taken ibuprofen  without relief.  She has not used her Maxalt   LKW: 1630  Modified rankin score: 0-Completely asymptomatic and back to baseline post- stroke IV Thrombolysis: No, too mild to treat EVT: No, LVO not suspected  NIHSS components Score: Comment  1a Level of Conscious 0[]  1[]  2[]  3[]      1b LOC Questions 0[]  1[]  2[]       1c LOC Commands 0[]  1[]  2[]       2 Best Gaze 0[]  1[]  2[]       3 Visual 0[]  1[]  2[]  3[]      4 Facial Palsy 0[]  1[]  2[]  3[]      5a Motor Arm - left 0[]  1[]  2[]  3[]  4[]  UN[]    5b Motor Arm - Right 0[]  1[]  2[]  3[]  4[]  UN[]    6a Motor Leg - Left 0[]  1[]  2[]  3[]  4[]  UN[]    6b Motor Leg - Right 0[]  1[]  2[]  3[]  4[]  UN[]    7 Limb Ataxia 0[]  1[]  2[]  UN[]      8 Sensory 0[]  1[]  2[]  UN[]      9 Best Language 0[]  1[]  2[]  3[]      10 Dysarthria 0[]  1[]  2[]  UN[]      11 Extinct. and Inattention 0[]  1[]  2[]       TOTAL:0       ROS  Comprehensive ROS performed and pertinent positives documented in HPI   Past History   Past Medical History:  Diagnosis Date   Arthritis 07/09/2012   Breast cancer (HCC)    Breast cancer, left breast (HCC) 06/04/2002   Left breast   Diverticulosis    Tubular adenoma of colon 09/2009    Past Surgical History:  Procedure Laterality Date   MASTECTOMY, RADICAL  2004   Modified Left   TRAM  2004    Family History: Family History  Problem Relation Age of Onset   Heart  attack Father    Breast cancer Mother    Colon cancer Neg Hx     Social History  reports that she quit smoking about 45 years ago. She has never used smokeless tobacco. She reports current alcohol use of about 2.0 standard drinks of alcohol per week. She reports that she does not use drugs.  Allergies  Allergen Reactions   Sulfamethoxazole     REACTION: unspecified    Medications   Current Facility-Administered Medications:    ketorolac  (TORADOL ) 15 MG/ML injection 15 mg, 15 mg, Intravenous, Once **AND** metoCLOPramide  (REGLAN ) 5 MG/5ML solution 10 mg, 10 mg, Oral, Once **AND** sodium chloride  0.9 % bolus 500 mL, 500 mL, Intravenous, Once, Remi, Devon, NP   meclizine  (ANTIVERT ) tablet 25 mg, 25 mg, Oral, BID PRN, Remi Pippin, NP  Current Outpatient Medications:    alendronate  (FOSAMAX ) 70 MG tablet, TAKE 1 TABLET BY MOUTH EVERY 7 DAYS WITH A FULL GLASS OF WATER ON AN EMPTY STOMACH, Disp: 12 tablet, Rfl: 4  escitalopram (LEXAPRO) 10 MG tablet, Take 10 mg by mouth daily., Disp: , Rfl:    ibuprofen  (ADVIL ,MOTRIN ) 200 MG tablet, Take 400 mg by mouth every 4 (four) hours as needed., Disp: , Rfl:    ondansetron  (ZOFRAN ) 4 MG tablet, Take 1 tablet (4 mg total) by mouth every 8 (eight) hours as needed for nausea or vomiting., Disp: 20 tablet, Rfl: 0   Probiotic Product (ALIGN) 4 MG CAPS, Take 1 capsule by mouth daily., Disp: 14 capsule, Rfl: 0   rizatriptan  (MAXALT ) 10 MG tablet, Take 1 tablet (10 mg total) by mouth as needed for migraine. May repeat in 2 hours if needed, Disp: 10 tablet, Rfl: 2   traZODone  (DESYREL ) 50 MG tablet, Take 0.5-1 tablets (25-50 mg total) by mouth at bedtime as needed for sleep. (Patient not taking: Reported on 05/24/2015), Disp: 30 tablet, Rfl: 3  Vitals   Vitals:   01-Dec-2023 1752 01-Dec-2023 1806 12/01/23 1809 12-01-2023 1811  BP:   (!) 143/100   Pulse:   88   Resp:   18   Temp:   98.5 F (36.9 C)   TempSrc:   Oral   SpO2:  96% 99%   Weight: 76.4 kg   76.4  kg  Height:    5' 3 (1.6 m)    Body mass index is 29.84 kg/m.   Physical Exam   Constitutional: Appears well-developed and well-nourished.  Psych: Affect appropriate to situation.  Eyes: No scleral injection.  HENT: No OP obstruction.  Head: Normocephalic.  Cardiovascular: Normal rate and regular rhythm.  Respiratory: Effort normal, non-labored breathing.  GI: Soft.  No distension. There is no tenderness.  Skin: WDI.   Neurologic Examination    Neuro: Mental Status: Patient is awake, alert, oriented to person, place, month, year, and situation. Patient is able to give a clear and coherent history. No signs of aphasia or neglect Cranial Nerves: II: Visual Fields are full. Pupils are equal, round, and reactive to light.   III,IV, VI: EOMI without ptosis or diploplia.  V: Facial sensation is symmetric to temperature VII: Facial movement is symmetric resting and smiling VIII: Hearing is intact to voice X: Palate elevates symmetrically XI: Shoulder shrug is symmetric. XII: Tongue protrudes midline without atrophy or fasciculations.  Motor: Tone is normal. Bulk is normal. 5/5 strength was present in all four extremities.  Sensory: Sensation is symmetric to light touch and temperature in the arms and legs. No extinction to DSS present.  Cerebellar: FNF and HKS are intact bilaterally Gait is slow, but steady.  Able to toe walk       Labs/Imaging/Neurodiagnostic studies   CBC:  Recent Labs  Lab 2023-12-01 1748 12-01-2023 1752  WBC 7.2  --   NEUTROABS 4.8  --   HGB 16.9* 17.0*  HCT 49.7* 50.0*  MCV 88.9  --   PLT 289  --    Basic Metabolic Panel:  Lab Results  Component Value Date   NA 138 12-01-23   K 4.1 2023/12/01   CO2 24 06/20/2011   GLUCOSE 111 (H) 12-01-23   BUN 10 2023/12/01   CREATININE 0.70 2023/12/01   CALCIUM 10.0 06/20/2011   Lipid Panel: No results found for: LDLCALC HgbA1c: No results found for: HGBA1C Urine Drug Screen: No results  found for: LABOPIA, COCAINSCRNUR, LABBENZ, AMPHETMU, THCU, LABBARB  Alcohol Level No results found for: ETH INR No results found for: INR APTT No results found for: APTT AED levels: No results found for: PHENYTOIN, ZONISAMIDE, LAMOTRIGINE,  LEVETIRACETA  CT Head without contrast(Personally reviewed): 1. No acute intracranial abnormality related to the suspected stroke. ASPECTS 10   ASSESSMENT   BETSABE IGLESIA is a 68 y.o. female with a past medical history of migraines presenting with one week of headache. She took ibuprofen  without relief.  At this time she is reporting a headache of a 5 out of 10 and some dizziness while laying flat.  The dizziness does not get any better or worse when standing.  She is able to ambulate.   RECOMMENDATIONS  - MRI bvrain w wo stat - 500 cc normal saline bolus -Toradol  and Reglan  migraine cocktail -Antivert  as needed ______________________________________________________________________  Plan discussed with Dr. Franky Gaul with the ED team.  Signed, Jorene Last, NP [facility]  NEUROHOSPITALIST ADDENDUM Performed a face to face diagnostic evaluation.   I have reviewed the contents of history and physical exam as documented by PA/ARNP/Resident and agree with above documentation.  I have discussed and formulated the above plan as documented. Edits to the note have been made as needed.   Jody Aguinaga, MD [facility] [phone]   If 7pm to 7am, please call on call as listed on AMION.

## 2023-11-15 NOTE — Code Documentation (Signed)
 Stroke Response Nurse Documentation Code Documentation  JERNEE MURTAUGH is a 68 y.o. female arriving to [facility]  via [facility] on 11/15/2023 with past medical hx of migraines, breast cancers. On No antithrombotic. Code stroke was activated by EMS.   Patient from home where she was LKW at 1630 and now complaining of right gaze, dizziness, headache x1 week. Per patient, she has had a headache for about one week, dizziness for 24hours, and a right gaze that started at 1630 and last for about 15 minutes.   Stroke team at the bedside on patient arrival. Labs drawn and patient cleared for CT by EDP. Patient to CT with team. NIHSS 0, see documentation for details and code stroke times. The following imaging was completed:  CT Head. Patient is not a candidate for IV Thrombolytic due to too mild to treat per MD. Patient is not a candidate for IR due to no LVO not suspected per MD .   Care Plan: VS/NIHSS q2hr x12hr, then q4hr; BP Goal <220/120.   Bedside handoff with ED RN Andrea.    Annabella DELENA Bame  Stroke Response RN

## 2023-11-15 NOTE — ED Notes (Signed)
 Pt to MRI

## 2023-11-15 NOTE — Discharge Instructions (Signed)
 It was our pleasure to provide your ER care today - we hope that you feel better. Drink plenty of fluids/stay well hydrated.   Follow up closely with primary care doctor/neurologist in the next 1-2 weeks.  Your blood pressure is high today - follow up with primary care doctor in the next couple weeks.   Return to ER if worse, new symptoms, fevers, new/severe pain, chest pain, trouble breathing, numbness/weakness, change in speech/vision, fainting, or other concern.

## 2023-11-18 ENCOUNTER — Encounter: Payer: Self-pay | Admitting: Neurology

## 2024-01-07 ENCOUNTER — Encounter: Payer: Self-pay | Admitting: Neurology

## 2024-01-07 ENCOUNTER — Ambulatory Visit (INDEPENDENT_AMBULATORY_CARE_PROVIDER_SITE_OTHER): Admitting: Neurology

## 2024-01-07 VITALS — BP 147/96 | HR 127 | Ht 63.0 in | Wt 169.0 lb

## 2024-01-07 DIAGNOSIS — G43109 Migraine with aura, not intractable, without status migrainosus: Secondary | ICD-10-CM | POA: Diagnosis not present

## 2024-01-07 NOTE — Progress Notes (Signed)
 [facility] Clinic Note - Initial Visit   Date: 01/07/2024   Kristen Burke MRN: [id number] DOB: 1955-12-05   Dear Dr. Bernard:  Thank you for your kind referral of Kristen Burke for consultation of dizziness. Although her history is well known to you, please allow us  to reiterate it for the purpose of our medical record. The patient was accompanied to the clinic by self.    Kristen Burke is a 68 y.o. right-handed female with history of left breast cancer, hyperlipidemia, osteoporosis, and anxiety/depression presenting for evaluation of dizziness.   IMPRESSION/PLAN: Complex migraine associated with dizziness and vision changes.  MRI brain was personally viewed which shows mild chronic white matter changes and nothing abnormal to explain her symptoms.  Neurological exam is also reassuring today.  There is no indication for further testing, especially as she is asymptomatic.  Continue to monitor.   Return to clinic as needed ------------------------------------------------------------- History of present illness: Starting in September 2025, she began having bifrontal dull tension headache for about a week.  When she woke up on 9/12, she developed unsteadiness and noticed that her eyes were moving to the right, dizziness, imbalance, and nausea.  She recalls having a headache during that day.   She went to the ER for further evaluation where MRI brain did not show acute stroke.  She was given toradol , meclizine , and reglan  which improved her symptoms.  The following day, her vision, dizziness, and imbalance has resolved.  Her headache also resolved within a few days.  She has not had this recur.  Nonsmoker.  Rare alcohol use.  She lives at home with husband, daughter, and grandchildren.  She is retired from working at [employer] education officer, [employer].   Out-side paper records, electronic medical record, and images have been reviewed where available and summarized  as:  MRI brain wwo contrast 11/15/2023: 1. No acute intracranial abnormality. 2. Mildly advanced periventricular and scattered subcortical T2 hyperintensities for age, without associated pathologic enhancement. These are nonspecific but can be seen in the setting of chronic microvascular ischemia, a demyelinating process, or chronic headaches.    Lab Results  Component Value Date   TSH 0.64 08/10/2014    Past Medical History:  Diagnosis Date   Arthritis 07/09/2012   Breast cancer (HCC)    Breast cancer, left breast (HCC) 06/04/2002   Left breast   Diverticulosis    Tubular adenoma of colon 09/2009    Past Surgical History:  Procedure Laterality Date   MASTECTOMY, RADICAL  2004   Modified Left   TRAM  2004     Medications:  Outpatient Encounter Medications as of 01/07/2024  Medication Sig   citalopram (CELEXA) 20 MG tablet Take 20 mg by mouth daily.   escitalopram (LEXAPRO) 10 MG tablet Take 10 mg by mouth daily.   LORazepam (ATIVAN) 0.5 MG tablet Take 1 tablet at bedtime as needed and 0.5 tablet during the day if needed for anxiety.   rosuvastatin (CRESTOR) 10 MG tablet Take 10 mg by mouth. (Patient taking differently: Take 10 mg by mouth.)   alendronate  (FOSAMAX ) 70 MG tablet TAKE 1 TABLET BY MOUTH EVERY 7 DAYS WITH A FULL GLASS OF WATER ON AN EMPTY STOMACH   ibuprofen  (ADVIL ,MOTRIN ) 200 MG tablet Take 400 mg by mouth every 4 (four) hours as needed.   ondansetron  (ZOFRAN ) 4 MG tablet Take 1 tablet (4 mg total) by mouth every 8 (eight) hours as needed for nausea or vomiting.  Probiotic Product (ALIGN) 4 MG CAPS Take 1 capsule by mouth daily.   rizatriptan  (MAXALT ) 10 MG tablet Take 1 tablet (10 mg total) by mouth as needed for migraine. May repeat in 2 hours if needed   traZODone  (DESYREL ) 50 MG tablet Take 0.5-1 tablets (25-50 mg total) by mouth at bedtime as needed for sleep. (Patient not taking: Reported on 05/24/2015)   No facility-administered encounter medications on file  as of 01/07/2024.    Allergies:  Allergies  Allergen Reactions   Sulfamethoxazole     REACTION: unspecified    Family History: Family History  Problem Relation Age of Onset   Breast cancer Mother    Thyroid cancer Mother    Yvone' disease Mother    Heart attack Father    Breast cancer Paternal Grandmother    Colon cancer Neg Hx     Social History: Social History   Tobacco Use   Smoking status: Former    Current packs/day: 0.00    Types: Cigarettes    Quit date: 03/05/1978    Years since quitting: 45.8   Smokeless tobacco: Never  Substance Use Topics   Alcohol use: Yes    Alcohol/week: 2.0 standard drinks of alcohol    Types: 2 Cans of beer per week    Comment: Occasional Drink   Drug use: No   Social History   Social History Narrative   Daily caffeine   Are you right handed or left handed? Right Handed    Are you currently employed ? No    What is your current occupation? Retired    Do you live at home alone? No    Who lives with you? Husband   What type of home do you live in: 1 story or 2 story? Two story home    Vital Signs:  BP (!) 147/96 (BP Location: Right Arm)   Pulse (!) 127   Ht 5' 3 (1.6 m)   Wt 169 lb (76.7 kg)   SpO2 94%   BMI 29.94 kg/m    Neurological Exam: MENTAL STATUS including orientation to time, place, person, recent and remote memory, attentionspan and concentration, language, and fund of knowledge is normal.  Speech is not dysarthric.  CRANIAL NERVES: II:  No visual field defects.     III-IV-VI: Pupils equal round and reactive to light.  Normal conjugate, extra-ocular eye movements in all directions of gaze.  No nystagmus.  Mild left ptosis (old, as compared to driver's license photo) V:  Normal facial sensation.    VII:  Normal facial symmetry and movements.   VIII:  Normal hearing and vestibular function.   IX-X:  Normal palatal movement.   XI:  Normal shoulder shrug and head rotation.   XII:  Normal tongue strength and  range of motion, no deviation or fasciculation.  MOTOR:  Motor strength is 5/5 throughout.  No atrophy, fasciculations or abnormal movements.  No pronator drift.   MSRs:                                           Right        Left brachioradialis 2+  2+  biceps 2+  2+  triceps 2+  2+  patellar 2+  2+  ankle jerk 2+  2+  Hoffman no  no  plantar response down  down   SENSORY:  Normal and symmetric  perception of light touch, temperature, and vibration.  Romberg's sign absent.   COORDINATION/GAIT: Normal finger-to- nose-finger.  Intact rapid alternating movements bilaterally.   Gait narrow based and stable. Tandem and stressed gait intact.     Thank you for allowing me to participate in patient's care.  If I can answer any additional questions, I would be pleased to do so.    Sincerely,    Shantee Hayne K. Tobie, DO
